# Patient Record
Sex: Male | Born: 1970 | Race: White | Hispanic: No | Smoking: Former smoker
Health system: Southern US, Community
[De-identification: ages and names within clinical notes are randomized; demographics above are authoritative.]

## PROBLEM LIST (undated history)

## (undated) DIAGNOSIS — E669 Obesity, unspecified: Secondary | ICD-10-CM

## (undated) HISTORY — PX: NASAL SINUS SURGERY: SHX719

## (undated) HISTORY — PX: TRACHEOSTOMY: SUR1362

---

## 1990-12-08 HISTORY — PX: HERNIA REPAIR: SHX51

## 1995-12-09 HISTORY — PX: FOOT SURGERY: SHX648

## 2013-03-28 ENCOUNTER — Inpatient Hospital Stay: Payer: Self-pay | Admitting: Internal Medicine

## 2013-03-28 LAB — COMPREHENSIVE METABOLIC PANEL
Alkaline Phosphatase: 72 U/L (ref 50–136)
BUN: 10 mg/dL (ref 7–18)
Bilirubin,Total: 0.7 mg/dL (ref 0.2–1.0)
Calcium, Total: 8 mg/dL — ABNORMAL LOW (ref 8.5–10.1)
Co2: 28 mmol/L (ref 21–32)
Creatinine: 0.9 mg/dL (ref 0.60–1.30)
EGFR (African American): >60
EGFR (Non-African Amer.): >60
Glucose: 100 mg/dL — ABNORMAL HIGH (ref 65–99)
SGOT(AST): 25 U/L (ref 15–37)
Sodium: 138 mmol/L (ref 136–145)
Total Protein: 7.5 g/dL (ref 6.4–8.2)

## 2013-03-28 LAB — DRUG SCREEN, URINE
Amphetamines, Ur Screen: NEGATIVE (ref ?–1000)
Barbiturates, Ur Screen: NEGATIVE (ref ?–200)
Benzodiazepine, Ur Scrn: NEGATIVE (ref ?–200)
Cannabinoid 50 Ng, Ur ~~LOC~~: NEGATIVE (ref ?–50)
Cocaine Metabolite,Ur ~~LOC~~: NEGATIVE (ref ?–300)
MDMA (Ecstasy)Ur Screen: NEGATIVE (ref ?–500)
Methadone, Ur Screen: NEGATIVE (ref ?–300)
Opiate, Ur Screen: NEGATIVE (ref ?–300)
Tricyclic, Ur Screen: NEGATIVE (ref ?–1000)

## 2013-03-28 LAB — CBC
HCT: 40.9 % (ref 40.0–52.0)
HGB: 14.3 g/dL (ref 13.0–18.0)
MCH: 28.5 pg (ref 26.0–34.0)
MCV: 82 fL (ref 80–100)
Platelet: 140 10*3/uL — ABNORMAL LOW (ref 150–440)
RBC: 5 10*6/uL (ref 4.40–5.90)

## 2013-03-28 LAB — URINALYSIS, COMPLETE
Blood: NEGATIVE
Glucose,UR: NEGATIVE mg/dL (ref 0–75)
Ketone: NEGATIVE
Leukocyte Esterase: NEGATIVE
Nitrite: NEGATIVE
Ph: 7 (ref 4.5–8.0)

## 2013-03-28 LAB — APTT: Activated PTT: 32.6 secs (ref 23.6–35.9)

## 2013-03-28 LAB — PROTIME-INR
INR: 1
Prothrombin Time: 13.5 secs (ref 11.5–14.7)

## 2013-03-29 DIAGNOSIS — I6789 Other cerebrovascular disease: Secondary | ICD-10-CM

## 2013-03-29 LAB — CBC WITH DIFFERENTIAL/PLATELET
Basophil #: 0 10*3/uL (ref 0.0–0.1)
Eosinophil #: 0.1 10*3/uL (ref 0.0–0.7)
Eosinophil %: 1.9 %
HCT: 44.5 % (ref 40.0–52.0)
Lymphocyte #: 1.7 10*3/uL (ref 1.0–3.6)
Lymphocyte %: 36.5 %
MCH: 28.4 pg (ref 26.0–34.0)
MCHC: 34.9 g/dL (ref 32.0–36.0)
Monocyte #: 0.4 x10 3/mm (ref 0.2–1.0)
Monocyte %: 8.4 %
Neutrophil #: 2.4 10*3/uL (ref 1.4–6.5)
Neutrophil %: 52.6 %
Platelet: 157 10*3/uL (ref 150–440)
RBC: 5.47 10*6/uL (ref 4.40–5.90)
WBC: 4.6 10*3/uL (ref 3.8–10.6)

## 2013-03-29 LAB — LIPID PANEL
Cholesterol: 113 mg/dL (ref 0–200)
Ldl Cholesterol, Calc: 55 mg/dL (ref 0–100)

## 2013-03-29 LAB — TSH: Thyroid Stimulating Horm: 2.99 u[IU]/mL

## 2013-03-29 LAB — RAPID HIV-1/2 QL/CONFIRM: HIV-1/2,Rapid Ql: NEGATIVE

## 2013-03-30 ENCOUNTER — Ambulatory Visit: Payer: Self-pay | Admitting: Neurology

## 2013-03-31 LAB — CSF CELL CT + PROT + GLU PANEL
CSF Tube #: 3
Eosinophil: 0 %
Lymphocytes: 73 %
Monocytes/Macrophages: 27 %
Neutrophils: 0 %
Other Cells: 0 %
Protein, CSF: 24 mg/dL (ref 15–45)
RBC (CSF): 0 /mm3
WBC (CSF): 24 /mm3

## 2013-04-04 LAB — CSF CULTURE W GRAM STAIN

## 2013-04-04 LAB — CULTURE, BLOOD (SINGLE)

## 2013-04-06 ENCOUNTER — Encounter: Payer: Self-pay | Admitting: Family Medicine

## 2013-04-07 ENCOUNTER — Encounter: Payer: Self-pay | Admitting: Family Medicine

## 2015-03-30 NOTE — Consult Note (Signed)
PATIENT NAME:  Maurice Rogers, Maurice Rogers MR#:  [id number] DATE OF BIRTH:  12-Apr-1971  DATE OF CONSULTATION:  03/30/2013  CONSULTING PHYSICIAN:  Leotis Pain, MD  REASON FOR EVALUATION: Ataxia, unsteady gait.  HISTORY OF PRESENT ILLNESS: This is a pleasant, highly functional 44 year old gentleman admitted in April 21st with ataxia gait, fevers and coordination abnormalities. For the past week or so, the patient has been complaining of upper respiratory tract infection that has resolved. During the course of the upper respiratory tract infection, he had multiple bouts of fevers as high as 102. The day following Easter, Monday, after visiting his children for Easter, he noticed that his speech was not the same, appeared to be dysarthric, and he was unsteady on his feet. He describes himself while walking as if he is drunk. He went to primary doctor for further evaluation, and the primary doctor sent him into the hospital. The patient has been in usual health prior to this upper respiratory tract infection. No recent sick contacts. Has not been in the wooded area. Currently, denies any fever and chills. 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 No recent travel or any insect bites. The patient does not take any medications. Since admission, the patient had a noncontrast MRI that did not show any acute intracranial abnormalities.   PAST MEDICAL HISTORY: Negative.   PAST SURGICAL HISTORY: Negative.   ALLERGIES: Denies any drug allergies.   MEDICATIONS: Status post Augmentin x1 dose on admission.   FAMILY HISTORY: Mother has history of diabetes and hypertension.   SOCIAL HISTORY: He lives alone. No pets. No recent contacts. He is divorced, has 2 children. No drug use and no smoking history. Works as a Pharmacist, hospital in a Automotive engineer.   PHYSICAL EXAMINATION:  VITAL SIGNS: Include a temperature of 97.6, pulse 65, respirations 19, blood pressure 124/87. NEUROLOGICAL: The patient is alert, awake, oriented to  time, place, location. He is able to tell me the President of the [place] and the reason why he is in the hospital. Cranial nerve examination: Extraocular movements are intact. Pupils 3 mm to 2 mm, reactive bilaterally. Facial sensation and facial motor intact. Tongue is midline. Uvula elevates symmetrically. Shoulder shrug intact. Motor: Upper extremity is 5 out of 5, bilateral upper and lower extremities. Coordination: Finger-to-nose intact, but very minimal dysmetria, left more than right. Dysmetria on heel-to-shin, left more than right. Sensation intact to light touch and temperature. Gait: The patient is able to ambulate on his own, but has a positive Romberg and feels unsteady on his feet.  IMAGING: The patient is status post MRI of the brain noncontrast with no acute abnormalities.   LABORATORY: The patient has had his lab work showing he had elevated ESR, which is very minimal at 15. Basic labs such as Lyme, HIV, RPR and B12 are all pending.   IMPRESSION AND PLAN: A 44 year old male with recent history of upper respiratory tract infection, had fevers, currently afebrile, comes in with a few-day history of unsteady gait, dysarthria and difficulty with coordination. Apparently, his neurological exam has improved, and coordination has improved, but still not back to his baseline.   PLAN: At this point, the possibilities of viral encephalitis, such as possibility of herpes encephalitis, have not been ruled out, even with a normal MRI since it was noncontrast. I would recommend MRI with gadolinium to see if there is any intracranial enhancement. Would also recommend a lumbar puncture that would include a cell count, cytology, Lyme titer, HSV, PCR. Other  labs are pending and will follow, including Lyme, HIV and RPR along with B12. Please order physical therapy and occupational therapy  Thank you. It was a pleasure seeing this patient.   ____________________________ Leotis Pain,  MD yz:OSi D: 03/30/2013 12:01:27 ET T: 03/30/2013 12:29:13 ET JOB#: [id number]  cc: Leotis Pain, MD, <Dictator> Leotis Pain MD ELECTRONICALLY SIGNED 05/01/2013 19:15

## 2015-03-30 NOTE — Consult Note (Signed)
PATIENT NAME:  Maurice Rogers, Maurice Rogers MR#:  [id number] DATE OF BIRTH:  March 21, 1971  DATE OF CONSULTATION:  03/29/2013  REFERRING PHYSICIAN:  Alford Highlandichard Wieting, MD CONSULTING PHYSICIAN:  Stann Mainlandavid P. Sampson GoonFitzgerald, MD  REASON FOR CONSULTATION: Slurred speech, fever and ataxia.   HISTORY OF PRESENT ILLNESS: This is a very pleasant, highly functional, 44 year old gentleman who has few medical problems in the past, admitted on April 21st with new-onset  slurring of speech, difficulty walking and a fever. He reports that 1 week ago he started to feel like he had a virus. He thought maybe he also had allergies with sinus congestion. He had no fever at that time. However, 5 days ago he felt weak at night and then awoke in the morning feeling very hot. He took his temperature, it was 102. 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 The next morning, he thought he noticed that his speech was not quite normal and he was a little unsteady on his feet but he thought it was just because he was feeling ill. However, Monday, when he went to work, he  noticed that he could not speak in front of people and his handwriting had worsened and was difficult to read. He went to his primary care doctor who admitted him for further evaluation.   The patient reports that prior to this episode he had been in his usual state of health. 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 He has not had any recent travel, animal exposure or tick bites. He has had no recent sick contacts. He takes no medication and has not had any over-the-counter medications either. He lives at home alone. No travel in the last year. He is sexually active, last time about 1 month ago. No drug use or alcohol use.   Since admission, the patient has had a work-up including an MRI which was negative and blood work which has all been nonrevealing. Drug screen was also normal. He says he has had no further  fevers, however, his speech has not improved.   PAST MEDICAL HISTORY:  Negative.   PAST SURGICAL HISTORY:  Negative.   ALLERGIES: No known drug allergies.   MEDICATIONS: As an outpatient he took no medicines. Currently, he is on only acetaminophen. He did receive aspirin. He also received 1 dose of amoxicillin clavulanic acid on April 22nd at 10:00 in the morning.   FAMILY HISTORY: His mother is alive, has diabetes and high blood pressure. He does not know his father's history.   SOCIAL HISTORY: He lives alone, has no pets. He is divorced, has 2 children age 46 and 629 who live about 1 hour away. He quit smoking about 10 years ago. He drinks alcohol once every few weeks. No illicit drug use. Sexually active, last time about 1 month ago. 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 No exposure to heavy metals or dust. He is not a camper or outdoor person, no tick bites or pet exposure. No travel.   ALLERGIES: No known drug allergies.   PHYSICAL EXAMINATION: VITALS: T-max since admission 98.3 but he reports a temperature of 102 on Friday, pulse 55 to 63, respirations 18, blood pressure 155/102, down to 131/86, sat 98% on room air.  GENERAL: He is a large, well-built gentleman in no acute distress. He does have obvious slurred speech but does not seem perturbed by this. His mood is good. Affect is appropriate.  His pupils are equal, round, reactive to light and accommodation. Extraocular movements are intact. Cranial nerves II through XII are intact.  OROPHARYNX: He does have slight elevation of his left lung at the base and maybe some asymmetric enlargement of his left tonsil and just mild erythema. He points to a spot on hard palate that he says is tender but there are no sores there. There are no palpable lesions in his throat. His dentition is moderate.  NECK: Supple. He has no anterior cervical, posterior cervical or supraclavicular lymphadenopathy.  HEART: Regular.  LUNGS: Clear to auscultation  bilaterally.  ABDOMEN: Soft, nontender, no1ndistended. No hepatosplenomegaly.  EXTREMITIES: He has no clubbing, cyanosis or edema.  SKIN: He has on his left inner upper arm a heart-shaped area of slightly raised edges that is approximately 5 to 6 cm across. There is central clearing. It appears as if it could be a tinea-type rash. 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  NEUROLOGIC: He is awake, alert, interactive and oriented x 3. He does have slurred speech and is speaking quite slowly. Strength is 5 out of 5 bilateral upper extremity and lower extremities. There is no tremor. He has poor finger-to-nose bilaterally. Heel-to-shin is decreased on the right. His gait is slightly ataxic but he can walk a straight line without falling. Romberg is negative but he does seem a little unsteady with his eyes closed, but he does not fall.   LABORATORY, DIAGNOSTIC AND RADIOLOGICAL DATA: White blood count on admission 3.9, hemoglobin 14.3, platelets 140. INR 1.0, PTT normal. Urinalysis negative. Drug screen urine negative. LFTs normal. Glucose 100, BUN 10, creatinine 0.9. Sodium, potassium, chloride and bicarb are normal. LDL is 55. Calcium is slightly low at 8.0. MRI done without gadolinium reveals no MRI evidence of focal or acute abnormalities. Carotid ultrasound is negative.   IMPRESSION: A pleasant 44 year old functional gentleman with no medical history, now with a fever followed by dysarthria, some slurred speech and some mild ataxia. On exam, he does have slightly enlarged left tonsil and slight elevation of his left tongue but no other oral lesions. His cranial nerves and strength are intact. He does have poor finger-to-nose and some mild ataxia. MRI is normal. Initial blood work is negative except for some thrombocytopenia.   The differential diagnosis is broad for this and includes both infectious and noninfectious etiologies. He did have a fever but  his white count is normal. He has really no significant exposures that he can remember, but I have asked him to think back over the last several months to see if he has had any unusual exposures.   RECOMMENDATIONS: 1.  We will check RPR, HIV, Lyme serologies, B12, TSH and a sed rate.  2.  Consult neurology.   3.  He likely would need an LP but will see what neurology thinks tomorrow.  4.  Since he has no fever currently and is not septic, I doubt this is a bacterial process. I would recommend holding any antibiotics. We will check blood cultures x 2 sets in case he has some occult endocarditis, although he did receive a dose of Augmentin so that may be falsely negative.   Thank you for the consult. I will be glad to follow with you.    ____________________________ Stann Mainland. Sampson Goon, MD dpf:cs D: 03/29/2013 17:09:42 ET T: 03/29/2013 18:33:34 ET JOB#: [id number]  cc: Stann Mainland. Sampson Goon, MD, <Dictator> DAVID Sampson Goon MD ELECTRONICALLY  SIGNED 04/06/2013 12:09

## 2015-03-30 NOTE — Consult Note (Signed)
PATIENT NAME:  Maurice Rogers, Maurice Rogers MR#:  [id number] DATE OF BIRTH:  05/09/1971  DATE OF CONSULTATION:  03/31/2013  CONSULTING PHYSICIAN:  Pauletta BrownsYuriy Tanza Pellot, MD  REASON FOR RE-EVALUATION: Ataxia, unsteady gait and dysarthric speech.   HISTORY OF PRESENT ILLNESS: Pleasant, highly functional 44 year old gentleman admitted on April 21 with ataxic gait, fevers, coordination abnormalities and dysarthric speech. Recent history includes upper respiratory tract infections and fevers, post which the patient has been complaining of above symptoms. Throughout the hospital course, the patient states he is feeling slightly better but not back to his baseline. He is status post noncontrast MRI of the brain and contrast MRI of the brain yesterday that did not show any signs of encephalitis and no signs of any enhancement, which was a normal study. The patient is status post lumbar puncture this morning, done by Dr. Sampson GoonFitzgerald.   PHYSICAL EXAMINATION:   VITAL SIGNS: Temperature of 98.2, pulse 64, respirations 18, blood pressure 132/83. NEUROLOGIC: The patient is alert, awake, oriented to time, place, location. He is telling me the date and time, as well as the president of the [place]. Cranial nerve examination: Extraocular movements are intact. Pupils 3 mm to 2 mm and reactive. Facial sensation and facial motor intact. Visual fields intact bilaterally. Tongue is midline. Uvula elevates symmetrically. Shoulder shrug intact. Motor examination: 5/5 bilateral upper and lower extremities. Coordination: Finger-to-nose intact. Dysarthric speech is present. Slight dysmetria on heel-to-shin, mostly on the left. Gait: Not assessed, as the patient is status post LP, to make sure the patient does not have post LP headache.  IMAGING: MRI of the brain without contrast and MRI of brain with gadolinium as above, no acute intracranial abnormality.   IMPRESSION: A 44 year old gentleman with a history upper respiratory tract infection  comes in with 2-day history of dysarthric speech, ataxia and difficulty with coordination. His neurological exam is improving but still not back to baseline. At this point in time, the etiology of such symptoms is not clear. Still suspect it is viral etiology and that we might not be able to obtain history of, or will not be able to see on lumbar puncture. Dr. Sampson GoonFitzgerald was nice enough to do the lumbar puncture and send typical stuff such as CMV, Lyme, HIV, RPR and EBV. There are other diagnoses that are possible such as episodic ataxia type I, type II, and other degenerative neurological conditions, but usually they do not present all of a sudden and if they do at his age, it would not be the first-time event, which the patient states it clearly is.  PLAN: Supportive care at this time, hydration, physical therapy, occupational therapy, speech therapy. Will review results of lumbar puncture once available.  Thank you. It was a pleasure seeing this patient.   ____________________________ Pauletta BrownsYuriy Emaad Nanna, MD yz:jm D: 03/31/2013 14:48:39 ET T: 03/31/2013 15:28:20 ET JOB#: [id number]  cc: Pauletta BrownsYuriy Yakelin Grenier, MD, <Dictator> Pauletta BrownsYURIY Sloka Volante MD ELECTRONICALLY SIGNED 05/01/2013 19:16

## 2015-03-30 NOTE — H&P (Signed)
PATIENT NAME:  Maurice Rogers, Maurice C MR#:  [id number] DATE OF BIRTH:  10-Feb-1971  DATE OF ADMISSION:  03/28/2013  PRIMARY CARE PHYSICIAN:  Dr. Vonita MossMark Crissman  CHIEF COMPLAINT:  Problems with coordination, balance and ataxic gait.   HISTORY OF PRESENT ILLNESS: This is a 44 year old male sent over to the ER from his primary care physician's office due to ataxic gait and problems with coordination. The patient says that he had not been feeling well over the weekend, where he had some upper respiratory symptoms and just generally felt weak. Over the weekend he also noticed that he had problems with coordination, where he was swaying and walking to one side or the other. The patient this morning went to work, as he works at [employer] as a Runner, broadcasting/film/videoteacher, and his handwriting was also quite terrible, as per him. He went to see his primary care physician, who then referred him to the ER. In the Emergency Room, the patient was evaluated by the ER physician and noted to have an abnormal finger-to-nose test, and also a mildly ataxic gait on ambulation. Hospitalist services were contacted for further treatment and evaluation. The patient denies any headache, any nausea, vomiting, any focal weakness, or any numbness and tingling presently.   REVIEW OF SYSTEMS: CONSTITUTIONAL:  No documented fever. No weight gain. No weight loss.  EYES:  No blurry or double vision.  EARS, NOSE, THROAT:  No tinnitus. No postnasal drip. No redness of oropharynx.  RESPIRATORY:  No cough, no wheeze, no hemoptysis, no dyspnea.  CARDIOVASCULAR:  No chest pain, no orthopnea, no palpitations, no syncope.  GASTROINTESTINAL:  No nausea, no vomiting, no diarrhea, no abdominal pain, no melena or hematochezia.  GENITOURINARY: No dysuria, no hematuria.  ENDOCRINE:  No polyuria or nocturia. No heat or cold intolerance.   HEMATOLOGIC:  No anemia. No bruising. No bleeding.  INTEGUMENT:  No rashes. No lesions.  MUSCULOSKELETAL:  No arthritis, no  swelling, no gout.  NEUROLOGIC:  No numbness or tingling. No ataxia. No seizure-type activity.  PSYCHIATRIC:  No anxiety. No insomnia. No ADD.   PAST MEDICAL HISTORY:  Has no significant past medical history.   ALLERGIES:  No known drug allergies.   SOCIAL HISTORY:  He used to be a smoker, quit 10+ years ago. No alcohol abuse. No illicit drug abuse. Lives at home by himself.   FAMILY HISTORY:  Mother is alive, has diabetes and high blood pressure, and a history of coronary artery disease. He cannot recall his father's history.   CURRENT MEDICATIONS:  He is currently on no medications.   PHYSICAL EXAMINATION ON ADMISSION IS AS FOLLOWS:  Patient's vital signs are noted to be temperature 98.4, pulse 67, respirations 18, blood pressure 168/90, sats 95% on room air.  GENERAL:  The patient is a pleasant-appearing male in no apparent distress.  HEAD, EYES, EARS, NOSE, THROAT EXAM:  Atraumatic, normocephalic. Extraocular muscles are intact. Pupils equal and reactive to light. Sclerae are anicteric. No conjunctival injection. No oropharyngeal erythema.  NECK: Supple. There is no jugular venous distention. No bruits. No lymphadenopathy or thyromegaly.  HEART EXAM:  Regular rate and rhythm. No murmurs, no rubs, no clicks.  LUNGS:  Clear to auscultation bilaterally. No rales. No rhonchi. No wheezes.  ABDOMEN: Soft, flat, nontender, nondistended. Has good bowel sounds. No hepatosplenomegaly appreciated.  EXTREMITIES:  No evidence of any cyanosis, clubbing, or peripheral edema. Has +2 pedal and radial pulses bilaterally.  NEUROLOGICAL:  Patient is alert, awake, oriented x 3. Patient  does have a slightly ataxic gait and a positive finger-to-nose test, but otherwise no other focal motor or sensory deficits appreciated bilaterally.  SKIN:  Moist and warm, with no rashes appreciated.  LYMPHATIC:  There is no cervical or axillary lymphadenopathy.   LABORATORY EXAM:  Showed a serum glucose of 100, BUN 10,  creatinine 0.9, sodium 138, potassium 3.9, chloride 104, bicarb 28. LFTs are within normal limits. White cell count 3.9, hemoglobin 14.3, hematocrit 40.9, platelet count 140.  INR 1. The patient did have a CT of the head done, which showed no evidence of any acute intracranial abnormality.   ASSESSMENT AND PLAN:  This is a 44 year old male with no significant past medical history who presents to the hospital with ataxic gait and problems with coordination, suspected to have a cerebrovascular accident.  Ataxic gait. Suspected CVA. I will observe the patient on telemetry, follow q. 4 hour neuro checks, watch for any arrhythmias. His initial CT head is negative. His exam is very subtly positive for an ataxic gait and a positive finger-to-nose test presently. Will go ahead and get an MRI of the brain, get a carotid duplex and a 2-dimensional echocardiogram. Will continue him on some aspirin. Will check a lipid profile in the morning for now.   The plan was discussed with the patient and the patient's mother, and they are in agreement. The patient is a FULL CODE.   Time spent on admission is 45 minutes.    ____________________________ Rolly Pancake. Cherlynn Kaiser, MD vjs:mr D: 03/28/2013 19:04:12 ET T: 03/28/2013 19:52:49 ET JOB#: [id number]  cc: Rolly Pancake. Cherlynn Kaiser, MD, <Dictator> [place] Siren MD ELECTRONICALLY SIGNED 04/02/2013 14:47

## 2015-03-30 NOTE — Discharge Summary (Signed)
PATIENT NAME:  Maurice Rogers, Maurice Rogers MR#:  [id number] DATE OF BIRTH:  19-Mar-1971  DATE OF ADMISSION:  03/28/2013  DATE OF DISCHARGE:  04/01/2013  ADMITTING PHYSICIAN:  Dr. Abel Presto.   DISCHARGING PHYSICIAN: Gladstone Lighter.      PRIMARY CARE PHYSICIAN:  Dr. Golden Pop.   CONSULTATIONS IN THE HOSPITAL:  1.  ID consultation with Dr. Ola Spurr.  2.  Neurology consultation by Dr. Irish Elders.   DISCHARGE DIAGNOSES: 1.  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  2.  Psoriasis.   DISCHARGE HOME MEDICATIONS:  None.  DISCHARGE DIET:  Regular diet.   DISCHARGE ACTIVITY: As tolerated.     FOLLOWUP INSTRUCTIONS: 1.  Follow up with Dr. Ola Spurr in 1 week.  2.  PCP followup in 2 weeks.   LABORATORY AND IMAGING STUDIES PRIOR TO DISCHARGE: CSF cell count with 24 WBCs, with 0 RBCs and 73% of them were lymphocytes and 27% were monocytes. CSF Gram stain and cultures were negative. Blood cultures were negative. MRI of the brain with and without contrast showing no evidence of abnormal parenchymal enhancement, nonenhancing masses or nodules. No acute changes seen.   WBC 4.6, hemoglobin 15.3, hematocrit 44.5, platelet count 157. Lyme antibodies for IgM were negative. Lying Western blot showing positive IgG antibodies. ESR slightly elevated at 18. TSH normal at 2.99.  HIV test is negative. RPR is nonreactive. Vitamin B12 is within normal limits at 483.   Echo Doppler showing  Normal LV ejection fraction, EF of 55% to 60% noted. MRI of the brain without contrast showing no evidence of acute abnormalities. LDL 55, HDL 19, triglycerides 193, total cholesterol 113.   Ultrasound Doppler's carotids showing no significant stenosis.  Urinalysis negative for infection. Urine tox screen is negative. CT of the head negative on admission.   BRIEF HOSPITAL COURSE:  The patient is a 44 year old male with no significant past medical history, who works as a professor at [employer], comes to the hospital secondary to ataxia and also problems with coordination, especially speech, and also handwriting. Initially admitted for transient ischemic attack.  1.  Ataxia and ataxic dysarthria with stiffness while walking, and also fine coordination, like handwriting and also while balancing. Initially thought to be cerebellar transient ischemic attack. MRI of the brain negative. Carotid Dopplers normal. Echo was normal. He does not have risk factors for any cerebrovascular accident or transient ischemic attack. His symptoms persisted while in the hospital, so ID consult and neurology consult were requested.  The patient had viral cold-like symptoms 2 days prior to the onset of his neurological symptoms. His lumbar puncture did reveal left-sided predominance of only elevated white cell count without any WBCs,  so it could be nonspecific versus postviral syndrome, which is common in pediatric patients.  However, all the other PCRs for different viruses have been sent out.  Lyme  disease, RPR, HIV came back negative. The patient's symptoms did not improve, also did not get worse. If this is a postviral encephalitis kind of syndrome, usually resolves spontaneously over time. Since he is in the profession of teaching and because of loss of coordination with handwriting and also speaking, he was given a work note for 2 weeks off, and he will follow up with outpatient speech and also physical therapy as needed. He will follow up with Dr. Ola Spurr next week when all the serologies will be back.  He was not given any other additional medications.  His MRI of the brain with and without  contrast was also negative for any meningitis or acute encephalitis. No evidence of bacterial infection, so no antibiotics were given. He had a dose of Augmentin when he came in for possible otitis media, which improved at this time.  2.  Psoriasis. The patient has early psoriasis, especially on his elbows and also  knees, and is not on any treatment at this time. His course has been otherwise  uneventful in the hospital.   DISCHARGE CONDITION: Stable.   DISCHARGE DISPOSITION: Home.   TIME SPENT ON DISCHARGE: 45 minutes.    ____________________________ Gladstone Lighter, MD rk:dmm D: 04/01/2013 15:56:00 ET T: 04/01/2013 22:33:59 ET JOB#: [id number]  cc: Gladstone Lighter, MD, <Dictator> Cheral Marker. Ola Spurr, MD Guadalupe Maple, MD Gladstone Lighter MD ELECTRONICALLY SIGNED 04/07/2013 15:52

## 2015-07-05 ENCOUNTER — Ambulatory Visit (INDEPENDENT_AMBULATORY_CARE_PROVIDER_SITE_OTHER): Payer: BC Managed Care – PPO | Admitting: Family Medicine

## 2015-07-05 ENCOUNTER — Encounter: Payer: Self-pay | Admitting: Family Medicine

## 2015-07-05 VITALS — BP 125/84 | HR 61 | Temp 98.9°F | Ht 70.4 in | Wt 261.9 lb

## 2015-07-05 DIAGNOSIS — Z113 Encounter for screening for infections with a predominantly sexual mode of transmission: Secondary | ICD-10-CM

## 2015-07-05 DIAGNOSIS — N485 Ulcer of penis: Secondary | ICD-10-CM

## 2015-07-05 MED ORDER — VALACYCLOVIR HCL 1 G PO TABS: 1000.0000 mg | ORAL_TABLET | Freq: Two times a day (BID) | ORAL | Status: DC

## 2015-07-05 MED ORDER — AZITHROMYCIN 250 MG PO TABS: ORAL_TABLET | ORAL | Status: DC

## 2015-07-05 NOTE — Progress Notes (Signed)
BP 125/84 mmHg  Pulse 61  Temp(Src) 98.9 F (37.2 C)  Ht 5' 10.4" (1.788 m)  Wt 261 lb 14.4 oz (118.797 kg)  BMI 37.16 kg/m2  SpO2 97%   Subjective:    Patient ID: Maurice Rogers, male    DOB: [birth year], 44 y.o.   MRN: [id number]  HPI: Maurice Rogers is a 44 y.o. male  Chief Complaint  Patient presents with  . skin lesion    friction burn on penis. patient was not sexually active for about 6 weeks, was getting better. Patient had sex over the now he has blisters, there was four now they are starting to heal over.   . std screening    All STD's    SKIN LESION- had had a friction burn on his penis that didn't seem to recover with sexual activity-  Duration: Saturday/Sunday Location: penis Painful: yes when touched Itching: no Onset: sudden Context: smaller Associated signs and symptoms: have turned white and are oozing  STD SCREENING Sexual activity:  In a Monogamous Relationship Contraception: no Recent unprotected intercourse: yes History of sexually transmitted diseases: no Previous sexually transmitted disease screening: no Lifetime sexual partners:  Genital lesions: yes Penile discharge: no Dysuria: no Swollen lymph nodes: no Fevers: no Rash: yes  Relevant past medical, surgical, family and social history reviewed and updated as indicated. Interim medical history since our last visit reviewed. Allergies and medications reviewed and updated.  Review of Systems  Constitutional: Negative.   Respiratory: Negative.   Cardiovascular: Negative.   Gastrointestinal: Negative.   Genitourinary: Negative.   Skin: Positive for rash. Negative for color change, pallor and wound.  Psychiatric/Behavioral: Negative.    Per HPI unless specifically indicated above     Objective:    BP 125/84 mmHg  Pulse 61  Temp(Src) 98.9 F (37.2 C)  Ht 5' 10.4" (1.788 m)  Wt 261 lb 14.4 oz (118.797 kg)  BMI 37.16 kg/m2  SpO2 97%  Wt Readings from Last 3 Encounters:    07/05/15 261 lb 14.4 oz (118.797 kg)  12/12/14 250 lb (113.399 kg)    Physical Exam  Constitutional: He is oriented to person, place, and time. He appears well-developed and well-nourished. No distress.  HENT:  Head: Normocephalic and atraumatic.  Right Ear: Hearing normal.  Left Ear: Hearing normal.  Nose: Nose normal.  Eyes: Conjunctivae and lids are normal. Right eye exhibits no discharge. Left eye exhibits no discharge. No scleral icterus.  Cardiovascular: Normal rate, regular rhythm and normal heart sounds.  Exam reveals no gallop and no friction rub.   No murmur heard. Pulmonary/Chest: Effort normal and breath sounds normal. No respiratory distress. He has no wheezes. He has no rales. He exhibits no tenderness.  Genitourinary:     ulcers  Musculoskeletal: Normal range of motion.  Neurological: He is alert and oriented to person, place, and time.  Skin: Skin is intact. No rash noted.  Psychiatric: He has a normal mood and affect. His speech is normal and behavior is normal. Judgment and thought content normal. Cognition and memory are normal.  Nursing note and vitals reviewed.     Assessment & Plan:   Problem List Items Addressed This Visit    None    Visit Diagnoses    Penile ulcer    -  Primary    Possibly herpes or chanchroid or irrtiation from contact. Will test for all STIs. Will treat for herpes and chanchroid and advised abstaining until resolved.  Relevant Orders    Gram stain    Screening examination for venereal disease        Will check today. Labs drawn.     Relevant Orders    HIV antibody    Hepatitis, Acute    HSV(herpes simplex vrs) 1+2 ab-IgG    GC/Chlamydia Probe Amp    RPR    Gram stain        Follow up plan: Return if symptoms worsen or fail to improve.

## 2015-07-05 NOTE — Patient Instructions (Signed)

## 2015-07-06 LAB — HSV(HERPES SIMPLEX VRS) I + II AB-IGG
HSV 1 Glycoprotein G Ab, IgG: 51.7 index — ABNORMAL HIGH (ref 0.00–0.90)
HSV 2 Glycoprotein G Ab, IgG: 6.61 index — ABNORMAL HIGH (ref 0.00–0.90)

## 2015-07-06 LAB — RPR: RPR Ser Ql: NONREACTIVE

## 2015-07-06 LAB — HIV ANTIBODY (ROUTINE TESTING W REFLEX): HIV SCREEN 4TH GENERATION: NONREACTIVE

## 2015-07-06 LAB — HEPATITIS PANEL, ACUTE
HEP B S AG: NEGATIVE
Hep A IgM: NEGATIVE
Hep B C IgM: NEGATIVE

## 2015-07-07 LAB — GRAM STAIN: ORGANISM ID, BACTERIA: NONE SEEN

## 2015-07-07 LAB — GC/CHLAMYDIA PROBE AMP
CHLAMYDIA, DNA PROBE: NEGATIVE
Neisseria gonorrhoeae by PCR: NEGATIVE

## 2015-07-09 ENCOUNTER — Telehealth: Payer: Self-pay | Admitting: Family Medicine

## 2015-07-09 NOTE — Telephone Encounter (Signed)
Called to give patient results of his blood work. LMOM for him to call back. 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 This is likely what is causing the ulcers on his penis. First outbreak is usually the worst, he's on medicine for it already. If it seems to be happening more often, we will start him on medicine to prevent it. He does have the potential to pass it to any partners, condoms are the best way to prevent passing it.

## 2015-07-10 NOTE — Telephone Encounter (Signed)
Results given to patient. No questions.

## 2016-07-17 ENCOUNTER — Encounter: Payer: Self-pay | Admitting: Family Medicine

## 2016-07-17 ENCOUNTER — Ambulatory Visit (INDEPENDENT_AMBULATORY_CARE_PROVIDER_SITE_OTHER): Payer: BC Managed Care – PPO | Admitting: Family Medicine

## 2016-07-17 VITALS — BP 122/68 | HR 60 | Temp 98.0°F | Resp 16 | Ht 71.0 in | Wt 282.2 lb

## 2016-07-17 DIAGNOSIS — Z0000 Encounter for general adult medical examination without abnormal findings: Secondary | ICD-10-CM

## 2016-07-17 DIAGNOSIS — L409 Psoriasis, unspecified: Secondary | ICD-10-CM | POA: Diagnosis not present

## 2016-07-17 DIAGNOSIS — Z6839 Body mass index (BMI) 39.0-39.9, adult: Secondary | ICD-10-CM

## 2016-07-17 DIAGNOSIS — Z6841 Body Mass Index (BMI) 40.0 and over, adult: Secondary | ICD-10-CM

## 2016-07-17 DIAGNOSIS — Z8249 Family history of ischemic heart disease and other diseases of the circulatory system: Secondary | ICD-10-CM | POA: Diagnosis not present

## 2016-07-17 DIAGNOSIS — Z833 Family history of diabetes mellitus: Secondary | ICD-10-CM | POA: Diagnosis not present

## 2016-07-17 LAB — COMPLETE METABOLIC PANEL WITH GFR
ALBUMIN: 4.1 g/dL (ref 3.6–5.1)
ALK PHOS: 79 U/L (ref 40–115)
ALT: 13 U/L (ref 9–46)
AST: 15 U/L (ref 10–40)
BUN: 13 mg/dL (ref 7–25)
CO2: 29 mmol/L (ref 20–31)
Calcium: 9 mg/dL (ref 8.6–10.3)
Chloride: 103 mmol/L (ref 98–110)
Creat: 0.89 mg/dL (ref 0.60–1.35)
GFR, Est African American: >89 mL/min (ref >=60)
GFR, Est Non African American: >89 mL/min (ref >=60)
GLUCOSE: 86 mg/dL (ref 65–99)
POTASSIUM: 3.8 mmol/L (ref 3.5–5.3)
SODIUM: 138 mmol/L (ref 135–146)
Total Bilirubin: 0.9 mg/dL (ref 0.2–1.2)
Total Protein: 6.5 g/dL (ref 6.1–8.1)

## 2016-07-17 LAB — LIPID PANEL
CHOLESTEROL: 147 mg/dL (ref 125–200)
HDL: 34 mg/dL — AB (ref >=40)
LDL CALC: 74 mg/dL (ref <130)
TRIGLYCERIDES: 195 mg/dL — AB (ref <150)
Total CHOL/HDL Ratio: 4.3 Ratio (ref <=5.0)
VLDL: 39 mg/dL — ABNORMAL HIGH (ref <30)

## 2016-07-17 MED ORDER — TRIAMCINOLONE ACETONIDE 0.1 % EX CREA: 1.0000 "application " | TOPICAL_CREAM | Freq: Two times a day (BID) | CUTANEOUS | 0 refills | Status: DC

## 2016-07-17 NOTE — Assessment & Plan Note (Signed)
Reviewed health risks of obesity. Encouraged continued diet and lifestyle changes. Referred to DisposableNylon.bechoosemyplate.gov for help with food choices.  Pt has goals to lose weight.

## 2016-07-17 NOTE — Progress Notes (Signed)
Subjective:    Patient ID: Maurice Rogers, male    DOB: 08/12/1971, 45 y.o.   MRN: [id number]  HPI: Maurice Rogers is a 45 y.o. male presenting on 07/17/2016 for Establish Care   HPI  Pt presents to establish care today. Previous care provider was Olevia Perches, DO at [facility].  It has been 1 years since His last PCP visit. Records from previous provider will be requested and reviewed. Current medical problems include:  Health maintenance:  No family history of prostate or colon.  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. 4-6 days per week. Doing resistance and cardio. 30 minutes cardio/30 resistance/ 30 minutes.  Strong family history of diabetes.  TDAP 2013.  PSA 2015.  Non-smoker. No ETOH.  Sexually active- STI testing last year.    No past medical history on file. Social History   Social History  . Marital status: Divorced    Spouse name: N/A  . Number of children: N/A  . Years of education: N/A   Occupational History  . Not on file.   Social History Main Topics  . Smoking status: Former Smoker    Quit date: 12/08/2000  . Smokeless tobacco: Never Used  . Alcohol use No  . Drug use: No  . Sexual activity: Not Currently   Other Topics Concern  . Not on file   Social History Narrative  . No narrative on file   Family History  Problem Relation Age of Onset  . Diabetes Mother   . Mental illness Mother     bipolar, depression  . Seizures Mother   . Stroke Mother   . Diabetes Sister   . Diabetes Maternal Grandfather   . CAD Paternal Grandfather   . Hyperlipidemia Paternal Grandfather   . Hypertension Paternal Grandfather   . Heart disease Paternal Grandfather   . Cancer Paternal Grandfather   . Heart disease Father   . Dementia Maternal Grandmother    No current outpatient prescriptions on file prior to visit.   No current facility-administered medications on file prior to visit.     Review of Systems  Constitutional:  Negative for chills and fever.  HENT: Negative.   Respiratory: Negative for chest tightness, shortness of breath and wheezing.   Cardiovascular: Negative for chest pain, palpitations and leg swelling.  Gastrointestinal: Negative for abdominal pain, nausea and vomiting.  Endocrine: Negative.   Genitourinary: Negative for discharge, dysuria, penile pain, testicular pain and urgency.  Musculoskeletal: Negative for arthralgias, back pain and joint swelling.  Skin: Negative.   Neurological: Negative for dizziness, weakness, numbness and headaches.  Psychiatric/Behavioral: Negative for dysphoric mood and sleep disturbance.   Per HPI unless specifically indicated above     Objective:    BP 122/68 (BP Location: Left Arm, Patient Position: Sitting, Cuff Size: Large)   Pulse 60   Temp 98 F (36.7 C) (Oral)   Resp 16   Ht  (1.803 m)   Wt 282 lb 3.2 oz (128 kg)   BMI 39.36 kg/m   Wt Readings from Last 3 Encounters:  07/17/16 282 lb 3.2 oz (128 kg)  07/05/15 261 lb 14.4 oz (118.8 kg)  12/12/14 250 lb (113.4 kg)    Physical Exam  Constitutional: He is oriented to person, place, and time. He appears well-developed and well-nourished. No distress.  HENT:  Head: Normocephalic and atraumatic.  Neck: Neck supple. No thyromegaly present.  Cardiovascular: Normal rate, regular rhythm and normal heart  sounds.  Exam reveals no gallop and no friction rub.   No murmur heard. Pulmonary/Chest: Effort normal and breath sounds normal. He has no wheezes.  Abdominal: Soft. Bowel sounds are normal. He exhibits no distension. There is no tenderness. There is no rebound.  Genitourinary:  Genitourinary Comments: Deferred 2/2 patient preference.   Musculoskeletal: Normal range of motion. He exhibits no edema or tenderness.  Neurological: He is alert and oriented to person, place, and time. He has normal reflexes.  Skin: Skin is warm and dry. No rash noted. No erythema.  Psychiatric: He has a normal  mood and affect. His behavior is normal. Thought content normal.   Results for orders placed or performed in visit on 07/05/15  GC/Chlamydia Probe Amp  Result Value Ref Range   Chlamydia trachomatis, NAA Negative Negative   Neisseria gonorrhoeae by PCR Negative Negative   PLEASE NOTE: Comment   Gram stain  Result Value Ref Range   Gram Stain Result Final report    Result 1 Comment    RESULT 2 No organisms seen   HIV antibody  Result Value Ref Range   HIV Screen 4th Generation wRfx Non Reactive Non Reactive  Hepatitis, Acute  Result Value Ref Range   Hep A IgM Negative Negative   Hepatitis B Surface Ag Negative Negative   Hep B C IgM Negative Negative   Hep C Virus Ab <0.1 0.0 - 0.9 s/co ratio  HSV(herpes simplex vrs) 1+2 ab-IgG  Result Value Ref Range   HSV 1 Glycoprotein G Ab, IgG 51.70 (H) 0.00 - 0.90 index   HSV 2 Glycoprotein G Ab, IgG 6.61 (H) 0.00 - 0.90 index  RPR  Result Value Ref Range   RPR Ser Ql Non Reactive Non Reactive      Assessment & Plan:   Problem List Items Addressed This Visit      Musculoskeletal and Integument   Psoriasis    Plaques on elbows and knees. Never been treated. Will treat with triamcinolone PRN.       Relevant Medications   triamcinolone cream (KENALOG) 0.1 %     Other   Family history of diabetes mellitus    Will screen hemoglobin A1c today.       Relevant Orders   Hemoglobin A1c   Family history of heart disease   Relevant Orders   Lipid Profile   BMI 39.0-39.9,adult    Reviewed health risks of obesity. Encouraged continued diet and lifestyle changes. Referred to DisposableNylon.bechoosemyplate.gov for help with food choices.  Pt has goals to lose weight.        Other Visit Diagnoses    Preventative health care    -  Primary   Relevant Orders   COMPLETE METABOLIC PANEL WITH GFR   Lipid Profile   Hemoglobin A1c      Meds ordered this encounter  Medications  . triamcinolone cream (KENALOG) 0.1 %    Sig: Apply 1 application  topically 2 (two) times daily.    Dispense:  30 g    Refill:  0    Order Specific Question:   Supervising Provider    Answer:   Janeann ForehandHAWKINS JR, JAMES H [[id number]]      Follow up plan: Return in about 1 year (around 07/17/2017), or if symptoms worsen or fail to improve.

## 2016-07-17 NOTE — Assessment & Plan Note (Signed)
Will screen hemoglobin A1c today.

## 2016-07-17 NOTE — Patient Instructions (Addendum)

## 2016-07-17 NOTE — Assessment & Plan Note (Signed)
Plaques on elbows and knees. Never been treated. Will treat with triamcinolone PRN.

## 2016-07-18 LAB — HEMOGLOBIN A1C
Hgb A1c MFr Bld: 4.7 % (ref <5.7)
MEAN PLASMA GLUCOSE: 88 mg/dL

## 2016-08-06 ENCOUNTER — Ambulatory Visit
Admission: RE | Admit: 2016-08-06 | Discharge: 2016-08-06 | Disposition: A | Discharge status: Home / Self Care | Payer: BC Managed Care – PPO | Source: Ambulatory Visit | Attending: Family Medicine | Admitting: Family Medicine

## 2016-08-06 ENCOUNTER — Encounter: Payer: Self-pay | Admitting: Family Medicine

## 2016-08-06 ENCOUNTER — Ambulatory Visit (INDEPENDENT_AMBULATORY_CARE_PROVIDER_SITE_OTHER): Payer: BC Managed Care – PPO | Admitting: Family Medicine

## 2016-08-06 VITALS — BP 136/86 | HR 57 | Temp 98.6°F | Resp 16 | Ht 71.0 in | Wt 275.6 lb

## 2016-08-06 DIAGNOSIS — J181 Lobar pneumonia, unspecified organism: Secondary | ICD-10-CM | POA: Diagnosis not present

## 2016-08-06 DIAGNOSIS — J069 Acute upper respiratory infection, unspecified: Secondary | ICD-10-CM | POA: Diagnosis not present

## 2016-08-06 DIAGNOSIS — R05 Cough: Secondary | ICD-10-CM

## 2016-08-06 DIAGNOSIS — R059 Cough, unspecified: Secondary | ICD-10-CM

## 2016-08-06 MED ORDER — AZITHROMYCIN 250 MG PO TABS: ORAL_TABLET | ORAL | 0 refills | Status: DC

## 2016-08-06 MED ORDER — PREDNISONE 20 MG PO TABS: 40.0000 mg | ORAL_TABLET | Freq: Every day | ORAL | 0 refills | Status: DC

## 2016-08-06 MED ORDER — BENZONATATE 100 MG PO CAPS: 100.0000 mg | ORAL_CAPSULE | Freq: Three times a day (TID) | ORAL | 0 refills | Status: DC | PRN

## 2016-08-06 MED ORDER — ALBUTEROL SULFATE HFA 108 (90 BASE) MCG/ACT IN AERS: 2.0000 | INHALATION_SPRAY | Freq: Four times a day (QID) | RESPIRATORY_TRACT | 0 refills | Status: DC | PRN

## 2016-08-06 NOTE — Patient Instructions (Addendum)
You can use supportive care at home to help with your symptoms. I have sent Mucinex DM to your pharmacy to help break up the congestion and soothe your cough. You can takes this twice daily.  I have also sent tesslon perles to your pharmacy to help with the cough- you can take these 3 times daily as needed. Honey is a natural cough suppressant- so add it to your tea in the morning.  If you have a humidifer, set that up in your bedroom at night.   Please seek immediate medical attention if you develop shortness of breath not relieve by inhaler, chest pain/tightness, fever > 103 F or other concerning symptoms.   You can take sudafed for congestion over the counter. You can also use Mucinex DM to help with cough and congestion.

## 2016-08-06 NOTE — Progress Notes (Signed)
Subjective:    Patient ID: Maurice Rogers, male    DOB: 1971-03-11, 45 y.o.   MRN: [id number]  HPI: Maurice CairoBently C Bachicha is a 45 y.o. male presenting on 08/06/2016 for Cough (chest congestion achy onset last week 8 days Sx not improving runny nose )   HPI  Pt presents for cough and congestion. Symptoms started last Tuesday. Started with scratchy throat. Cough developed right away. Thought it was allergies. Never improved. Cough is productive of scant amounts of sputums. Some chest tightness. No fevers.   No past medical history on file.  No current outpatient prescriptions on file prior to visit.   No current facility-administered medications on file prior to visit.     Review of Systems Per HPI unless specifically indicated above     Objective:    BP 136/86 (BP Location: Right Arm, Patient Position: Sitting, Cuff Size: Normal)   Pulse (!) 57   Temp 98.6 F (37 C) (Oral)   Resp 16   Ht 5\' 11"  (1.803 m)   Wt 275 lb 9.6 oz (125 kg)   SpO2 97%   BMI 38.44 kg/m   Wt Readings from Last 3 Encounters:  08/06/16 275 lb 9.6 oz (125 kg)  07/17/16 282 lb 3.2 oz (128 kg)  07/05/15 261 lb 14.4 oz (118.8 kg)    Physical Exam  HENT:  Right Ear: Hearing and tympanic membrane normal.  Left Ear: Hearing and tympanic membrane normal.  Nose: Mucosal edema present. Right sinus exhibits no maxillary sinus tenderness and no frontal sinus tenderness. Left sinus exhibits no maxillary sinus tenderness and no frontal sinus tenderness.  Mouth/Throat: Oropharynx is clear and moist and mucous membranes are normal. No posterior oropharyngeal edema or posterior oropharyngeal erythema.  Pulmonary/Chest: He has no decreased breath sounds. He has wheezes in the right lower field and the left lower field. He has no rhonchi. He has rales in the left lower field. Chest wall is dull to percussion (LLL). He exhibits no mass and no tenderness.  Lymphadenopathy:    He has cervical adenopathy.       Right  cervical: Superficial cervical adenopathy present.       Left cervical: Superficial cervical adenopathy present.   Results for orders placed or performed in visit on 07/17/16  COMPLETE METABOLIC PANEL WITH GFR  Result Value Ref Range   Sodium 138 135 - 146 mmol/L   Potassium 3.8 3.5 - 5.3 mmol/L   Chloride 103 98 - 110 mmol/L   CO2 29 20 - 31 mmol/L   Glucose, Bld 86 65 - 99 mg/dL   BUN 13 7 - 25 mg/dL   Creat 0.860.89 5.780.60 - 4.691.35 mg/dL   Total Bilirubin 0.9 0.2 - 1.2 mg/dL   Alkaline Phosphatase 79 40 - 115 U/L   AST 15 10 - 40 U/L   ALT 13 9 - 46 U/L   Total Protein 6.5 6.1 - 8.1 g/dL   Albumin 4.1 3.6 - 5.1 g/dL   Calcium 9.0 8.6 - 62.910.3 mg/dL   GFR, Est African American >89 >=60 mL/min   GFR, Est Non African American >89 >=60 mL/min  Lipid Profile  Result Value Ref Range   Cholesterol 147 125 - 200 mg/dL   Triglycerides 528195 (H) <150 mg/dL   HDL 34 (L) >=41>=40 mg/dL   Total CHOL/HDL Ratio 4.3 <=5.0 Ratio   VLDL 39 (H) <30 mg/dL   LDL Cholesterol 74 <324<130 mg/dL  Hemoglobin M0NA1c  Result Value Ref Range  Hgb A1c MFr Bld 4.7 <5.7 %   Mean Plasma Glucose 88 mg/dL      Assessment & Plan:   Problem List Items Addressed This Visit    None    Visit Diagnoses    Cough    -  Primary   Likely 2/2 bronchitis but will rule out pnuemonia 2/2 crackles and consolidation. Prednisone and albuterol. Supportive care at home. Alarm symptoms reviewed. Re   Relevant Medications   predniSONE (DELTASONE) 20 MG tablet   albuterol (PROVENTIL HFA;VENTOLIN HFA) 108 (90 Base) MCG/ACT inhaler   azithromycin (ZITHROMAX) 250 MG tablet   benzonatate (TESSALON) 100 MG capsule   Other Relevant Orders   DG Chest 2 View   Upper respiratory infection       Supportive care. Alarm symptoms reviewed. Return if not improving.    Relevant Medications   azithromycin (ZITHROMAX) 250 MG tablet   Lung consolidation (HCC)       LLL- will XR to r/o pneumonia.    Relevant Medications   azithromycin (ZITHROMAX) 250  MG tablet   Other Relevant Orders   DG Chest 2 View      Meds ordered this encounter  Medications  . predniSONE (DELTASONE) 20 MG tablet    Sig: Take 2 tablets (40 mg total) by mouth daily with breakfast.    Dispense:  10 tablet    Refill:  0    Order Specific Question:   Supervising Provider    Answer:   Janeann Forehand [phone]  . albuterol (PROVENTIL HFA;VENTOLIN HFA) 108 (90 Base) MCG/ACT inhaler    Sig: Inhale 2 puffs into the lungs every 6 (six) hours as needed for wheezing or shortness of breath.    Dispense:  1 Inhaler    Refill:  0    Order Specific Question:   Supervising Provider    Answer:   Janeann Forehand [[id number]]  . azithromycin (ZITHROMAX) 250 MG tablet    Sig: Take 2 pills today and 1 pill daily until bottle empty.    Dispense:  6 tablet    Refill:  0    Order Specific Question:   Supervising Provider    Answer:   Janeann Forehand [[id number]]  . benzonatate (TESSALON) 100 MG capsule    Sig: Take 1 capsule (100 mg total) by mouth 3 (three) times daily as needed.    Dispense:  30 capsule    Refill:  0    Order Specific Question:   Supervising Provider    Answer:   Janeann Forehand [[id number]]      Follow up plan: Return if symptoms worsen or fail to improve.

## 2016-11-07 ENCOUNTER — Ambulatory Visit: Payer: BC Managed Care – PPO | Admitting: Family Medicine

## 2016-11-12 ENCOUNTER — Encounter: Payer: Self-pay | Admitting: Family Medicine

## 2016-11-12 ENCOUNTER — Ambulatory Visit (INDEPENDENT_AMBULATORY_CARE_PROVIDER_SITE_OTHER): Payer: BC Managed Care – PPO | Admitting: Family Medicine

## 2016-11-12 ENCOUNTER — Ambulatory Visit
Admission: RE | Admit: 2016-11-12 | Discharge: 2016-11-12 | Disposition: A | Discharge status: Home / Self Care | Payer: BC Managed Care – PPO | Source: Ambulatory Visit | Attending: Family Medicine | Admitting: Family Medicine

## 2016-11-12 VITALS — BP 138/80 | HR 66 | Temp 97.8°F | Resp 16 | Ht 71.0 in | Wt 284.6 lb

## 2016-11-12 DIAGNOSIS — L409 Psoriasis, unspecified: Secondary | ICD-10-CM | POA: Diagnosis not present

## 2016-11-12 DIAGNOSIS — M25561 Pain in right knee: Secondary | ICD-10-CM

## 2016-11-12 DIAGNOSIS — M25461 Effusion, right knee: Secondary | ICD-10-CM

## 2016-11-12 MED ORDER — NAPROXEN 500 MG PO TABS: 500.0000 mg | ORAL_TABLET | Freq: Two times a day (BID) | ORAL | 1 refills | Status: DC

## 2016-11-12 NOTE — Progress Notes (Signed)
Subjective:    Patient ID: Maurice Rogers, male    DOB: 11/21/1971, 45 y.o.   MRN: [id number]  Maurice Rogers is a 45 y.o. male presenting on 11/12/2016 for Knee Pain (right knee getting worst)   HPI    RIGHT KNEE PAIN and SWELLING, Chronic: - Reports initial onset of symptom with R knee pain and swelling about 4 months ago in August 2017, without any known inciting injury or trauma. He does not have a chronic history of knee pain and no prior knee problems or surgery. At time of onset he experienced acute onset "tightening" pain in muscles around knee and knee pain, initially 7/10 aching pain at worst, then better days 4/10 pain, describes tightening around knee worse with prolonged sitting and standing has stiffness and pain eases with walking and stretching it out, pain worse with knee flexion. Now worsening over past 1 week, difficulty sleeping at times due to knee pain. 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 Smoking status: Former Smoker    Quit date: 12/08/2000  . Smokeless tobacco: Never Used  . Alcohol use No    Review of Systems Per HPI unless specifically indicated above     Objective:    BP 138/80 (BP Location: Left Arm, Cuff Size: Normal)   Pulse 66   Temp 97.8 F (36.6 C) (Oral)   Resp 16   Ht 5\' 11"  (1.803 m)   Wt 284 lb 9.6 oz (129.1 kg)   BMI 39.69 kg/m   Wt Readings from Last 3 Encounters:  11/12/16 284 lb 9.6 oz (129.1 kg)  08/06/16 275 lb 9.6 oz (125 kg)    07/17/16 282 lb 3.2 oz (128 kg)    Physical Exam  Constitutional: He appears well-developed and well-nourished. No distress.  Well-appearing, comfortable, cooperative  HENT:  Head: Normocephalic and atraumatic.  Mouth/Throat: Oropharynx is clear and moist.  Cardiovascular: Normal rate and intact distal pulses.   Pulmonary/Chest: Effort normal.  Musculoskeletal:  Right Knee Inspection: Slightly bulkier appearance with mild joint effusion increased medially. Skin over patellar with dry rough patch consistent with psoriasis, bilateral knees. Palpation: Only mild tender +TTP medial joint line Right knee only. Significant fine crepitus bilateral (R>L) ROM: R knee flexion normal to about 90* then difficulty with pain and limited by swelling. Extension mostly normal. Special Testing: Lachman / Valgus/Varus tests negative with intact ligaments (ACL, MCL, LCL). McMurray negative without meniscus symptoms. Standing Thessaly negative for meniscus pain. Strength: 5/5 intact knee flex/ext, ankle dorsi/plantarflex Neurovascular: distally intact sensation light touch and pulses  Gait is mostly normal today, without significant limp or antalgic gait  Neurological: He is alert.  Skin: Skin is warm and dry. Rash (psoriasis patches over bilateral elbows and knees) noted. He is not diaphoretic. No erythema.  Nursing note and vitals reviewed.  I have personally reviewed the following lab results from 07/17/16.  Results for orders placed or performed in visit on 07/17/16  COMPLETE METABOLIC PANEL WITH GFR  Result Value Ref Range  Sodium 138 135 - 146 mmol/L   Potassium 3.8 3.5 - 5.3 mmol/L   Chloride 103 98 - 110 mmol/L   CO2 29 20 - 31 mmol/L   Glucose, Bld 86 65 - 99 mg/dL   BUN 13 7 - 25 mg/dL   Creat 1.610.89 0.960.60 - 0.451.35 mg/dL   Total Bilirubin 0.9 0.2 - 1.2 mg/dL   Alkaline Phosphatase 79 40 - 115 U/L   AST 15 10 - 40 U/L   ALT 13 9 - 46 U/L   Total Protein 6.5 6.1 - 8.1 g/dL   Albumin 4.1 3.6  - 5.1 g/dL   Calcium 9.0 8.6 - 40.910.3 mg/dL   GFR, Est African American >89 >=60 mL/min   GFR, Est Non African American >89 >=60 mL/min  Lipid Profile  Result Value Ref Range   Cholesterol 147 125 - 200 mg/dL   Triglycerides 811195 (H) <150 mg/dL   HDL 34 (L) >=91>=40 mg/dL   Total CHOL/HDL Ratio 4.3 <=5.0 Ratio   VLDL 39 (H) <30 mg/dL   LDL Cholesterol 74 <478<130 mg/dL  Hemoglobin G9FA1c  Result Value Ref Range   Hgb A1c MFr Bld 4.7 <5.7 %   Mean Plasma Glucose 88 mg/dL      Assessment & Plan:   Problem List Items Addressed This Visit    Right medial knee pain - Primary    Subacute worsening on chronic (4 months only) R knee medial pain and swelling without known injury or trauma. No prior known knee OA/DJD. 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 Start anti-inflammatory trial with rx Naprosyn 500mg  BID wc x 2-4 weeks, then PRN 2. Start Tylenol 500-1000mg  per dose TID PRN breakthrough 3. RICE therapy (rest, ice, compression, elevation) for swelling, activity modification, knee sleeve AND patellar strap 4. Knee x-rays ordered to evaluate DJD 5. Follow-up 4 weeks, if still worsening, consider muscle relaxant if night-time pain and spasms, steroid injection and referral to Ortho vs Sports Med for further eval       Relevant Medications   naproxen (NAPROSYN) 500 MG tablet   Other Relevant Orders   DG Knee Complete 4 Views Right   Psoriasis    Stable, chronic problem with skin manifestation of plaques on elbows and knees, treated with topical steroids - No known diagnosis of psoriatic arthritis, no other joint pain or  swelling       Other Visit Diagnoses    Effusion of bursa of right knee       Secondary response of inflammation with likely PFPS vs DJD knee, no evidence of infection.   Relevant Orders   DG Knee Complete 4 Views Right      Meds ordered this encounter  Medications  . naproxen (NAPROSYN) 500 MG tablet    Sig: Take 1 tablet (500 mg total) by mouth 2 (two) times daily with a meal. For 2-4 weeks then as needed    Dispense:  60 tablet    Refill:  1      Follow up plan: Return in about 4 weeks (around 12/10/2016) for Right knee pain.  Saralyn PilarAlexander Tnya Ades, DO [facility]  11/12/2016, 6:14 PM

## 2016-11-12 NOTE — Assessment & Plan Note (Signed)
Stable, chronic problem with skin manifestation of plaques on elbows and knees, treated with topical steroids - No known diagnosis of psoriatic arthritis, no other joint pain or swelling

## 2016-11-12 NOTE — Patient Instructions (Addendum)
Thank you for coming in to clinic today.  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 - Possible to have osteoarthritis or degenerative arthritis in your knee from chronic history of bending and kneeling  I do not think you have a torn ligament, or meniscus  - X-rays today, I will send results to MyChart with information once we receive it, likely within 24 hours  Recommend trial of Anti-inflammatory with Naproxen (Naprosyn) 500mg  tabs - take one with food and plenty of water TWICE daily every day (breakfast and dinner), for next 2 to 4 weeks, then you may take only as needed - DO NOT TAKE any ibuprofen, aleve, motrin while you are taking this medicine - It is safe to take Tylenol Ext Str 500mg  tabs - take 1 to 2 (max dose 1000mg ) every 6 hours as needed for breakthrough pain, max 24 hour daily dose is 6 to 8 tablets or 4000mg   Use RICE therapy: - R - Rest / relative rest with activity modification avoid overuse and frequent bending or pressure on bent knee - I - Ice packs (make sure you use a towel or something to protect skin - C - Compression for Knee Sleeve to help reduce swelling and allow support, also I do think it may be worth while to ALSO try a KNEE PATELLAR STRAP, pictured below, it sits directly below your patella or kneecap on the tendon and allows support with patellar movement, which I think is your problem - E - Elevation - if significant swelling, lift leg above heart level to allow improved reduced swelling.     In future can discuss physical therapy, steroid injection, or referral to orthopedics/sports medicine  Please schedule a follow-up appointment with Dr. Althea CharonKaramalegos in 4-6 weeks to follow-up Right knee pain  If you have any other questions or concerns, please feel free to call the clinic or send a message through MyChart. You may also schedule an earlier appointment if  necessary.  Saralyn PilarAlexander Cirilo Canner, DO [facility]    You can stretch your leg right away by doing the first 3 stretching exercises. Start strengthening your leg by doing the last 3 exercises.  Hamstring stretch on wall: Lie on your back with your buttocks close to a doorway, and extend your legs straight out in front of you along the floor. Raise the injured leg and rest it against the wall next to the door frame. Your other leg should extend through the doorway. You should feel a stretch in the back of your thigh. Hold this position for 15 to 30 seconds. Repeat 3 times.  Standing calf stretch: Facing a wall, put your hands against the wall at about eye level. Keep the injured leg back, the uninjured leg forward, and the heel of your injured leg on the floor. Turn your injured foot slightly inward (as if you were pigeon-toed) as you slowly lean into the wall until you feel a stretch in the back of your calf. Hold for 15 to 30 seconds. Repeat 3 times. Do this exercise several times each day.  Quadriceps stretch: Stand an arm's length away from the wall, facing straight ahead. Brace yourself by keeping the hand on the uninjured side against the wall. With your other hand, grasp the ankle of the injured leg and pull your heel toward your buttocks. Don't arch or twist your back and keep  your knees together. Hold this stretch for 15 to 30 seconds. Repeat 3 times.  Quadriceps isometrics: Sitting on the floor with your injured leg straight and your other leg bent, press the back of your knee into the floor by tightening the muscles on the top of your thigh. Hold this position 10 seconds. Relax. Do 3 sets of 10.  Heel slide: Sit on a firm surface with your legs straight in front of you. Slowly slide the heel of your injured leg toward your buttock by pulling your knee to your chest as you slide. Return to the starting position. Do 3 sets of 10.  Hamstring isometrics: Sitting on the  floor with the injured leg slightly bent, dig the heel of your injured leg into the floor and tighten up the back of your thigh muscles. Hold this position for 5 seconds. Do 3 sets of 10.

## 2016-11-12 NOTE — Assessment & Plan Note (Addendum)
Subacute worsening on chronic (4 months only) R knee medial pain and swelling without known injury or trauma. No prior known knee OA/DJD. 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 Start anti-inflammatory trial with rx Naprosyn 500mg  BID wc x 2-4 weeks, then PRN 2. Start Tylenol 500-1000mg  per dose TID PRN breakthrough 3. RICE therapy (rest, ice, compression, elevation) for swelling, activity modification, knee sleeve AND patellar strap 4. Knee x-rays ordered to evaluate DJD 5. Follow-up 4 weeks, if still worsening, consider muscle relaxant if night-time pain and spasms, steroid injection and referral to Ortho vs Sports Med for further eval

## 2016-12-04 ENCOUNTER — Ambulatory Visit (INDEPENDENT_AMBULATORY_CARE_PROVIDER_SITE_OTHER): Payer: BC Managed Care – PPO | Admitting: Family Medicine

## 2016-12-04 ENCOUNTER — Encounter: Payer: Self-pay | Admitting: Family Medicine

## 2016-12-04 VITALS — BP 141/81 | HR 80 | Temp 98.6°F | Resp 16 | Ht 71.0 in | Wt 284.0 lb

## 2016-12-04 DIAGNOSIS — L409 Psoriasis, unspecified: Secondary | ICD-10-CM | POA: Diagnosis not present

## 2016-12-04 DIAGNOSIS — M25461 Effusion, right knee: Secondary | ICD-10-CM

## 2016-12-04 DIAGNOSIS — M25561 Pain in right knee: Secondary | ICD-10-CM

## 2016-12-04 NOTE — Patient Instructions (Addendum)
Thank you for coming in to clinic today.  Urgent referral placed to Ortho today  [facility] (formerly [facility] Address: [address], [place], [place] [address] Hours:  9AM-5PM Phone: [phone]  Allena KatzMaurice Jones Jr, PA-C   Recommend trial of Anti-inflammatory with Naproxen (Naprosyn) 500mg  tabs - take one with food and plenty of water TWICE daily every day (breakfast and dinner), for next 2 to 4 weeks, then you may take only as needed - DO NOT TAKE any ibuprofen, aleve, motrin while you are taking this medicine - It is safe to take Tylenol Ext Str 500mg  tabs - take 1 to 2 (max dose 1000mg ) every 6 hours as needed for breakthrough pain, max 24 hour daily dose is 6 to 8 tablets or 4000mg   Please schedule a follow-up appointment with Dr. Althea CharonKaramalegos in 4-6 weeks to follow-up Right knee pain  If you have any other questions or concerns, please feel free to call the clinic or send a message through MyChart. You may also schedule an earlier appointment if necessary.  Saralyn PilarAlexander Morton Simson, DO [facility]

## 2016-12-04 NOTE — Assessment & Plan Note (Signed)
Persisetnt subacute worsening on chronic >5 months now R knee medial pain and swelling without known injury or trauma. 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 Concern with knee flexion overuse, had thought PFPS but not responding as expected to conservative therapy. - Prior exam not supportive of meniscus or ligament injury - Able to bear weight, no knee instability or mechanical locking - No prior history of knee surgery, arthroscopy - Not responding to conservative therapy - Unlikely psoriatic arthritis, but can consider in future if needed  Plan: 1. Referral to [facility] for further evaluation R knee pain and swelling, still with knee effusion today, no evidence of erythema or complication, however placed urgent referral for knee aspiration and fluid studies along with further work-up, likely pain improved with reduced swelling, unclear exact etiology of swelling other than DJD 2. Continue Naproxen 500mg  BID until see ortho, may take Tylenol PRN breakthrough 3. Continue RICE therapy if helping for swelling, activity modification, knee sleeve as needed 4. Defer further management and imaging to ortho at this point, in future if needed can consider steroid injections here, but would recommend further diagnostics first 5. Follow-up as needed, after sees ortho

## 2016-12-04 NOTE — Progress Notes (Signed)
Subjective:    Patient ID: Maurice Rogers, male    DOB: 1971/01/09, 45 y.o.   MRN: [id number]  Maurice CairoBently C Debruyn is a 45 y.o. male presenting on 12/04/2016 for Knee Pain (Right)   HPI    RIGHT KNEE PAIN and SWELLING, Chronic: - Last visit 11/12/16 for same complaint, see note for background history, thought to be PFPS, had X-ray showed mild DJD patellofemoral and medial compartment, consistent with symptoms - Reports overall he is not improved and essentially knee pain and swelling is same as last visit. Describes only mild improvement during day on naproxen (still taking 500mg  BID with some relief) taking tylenol, seems improved during the day with more movement and activity, worsening with staying seated or sleeping will get stiff and more pain in morning, describes a sharp stabbing pain severe 6-7/10, but then later with activity and improvement more of an aching pain 4-5/10 like before. - Also admits some pain at night difficulty falling asleep and sometimes will wake him up - Using compression sleeve, patellar strap and ice packs, without significant relief - Admits associated persistent R knee swelling, worse medial, without relief with elevation - No known history of arthritis elsewhere - Denies any fevers/chills, numbness, tingling, weakness, redness  PMH Psoriasis but never had joint involvement  Social History  Substance Use Topics  . Smoking status: Former Smoker    Quit date: 12/08/2000  . Smokeless tobacco: Never Used  . Alcohol use No    Review of Systems Per HPI unless specifically indicated above     Objective:    BP (!) 141/81   Pulse 80   Temp 98.6 F (37 C) (Oral)   Resp 16   Ht 5\' 11"  (1.803 m)   Wt 284 lb (128.8 kg)   BMI 39.61 kg/m   Wt Readings from Last 3 Encounters:  12/04/16 284 lb (128.8 kg)  11/12/16 284 lb 9.6 oz (129.1 kg)  08/06/16 275 lb 9.6 oz (125 kg)    Physical Exam  Constitutional: He appears well-developed and well-nourished. No  distress.  Well-appearing, comfortable, cooperative  HENT:  Head: Normocephalic and atraumatic.  Mouth/Throat: Oropharynx is clear and moist.  Cardiovascular: Normal rate and intact distal pulses.   Pulmonary/Chest: Effort normal.  Musculoskeletal:  Right Knee - Essentially stable since last exam 12/6 Inspection: Slightly bulkier appearance with mild to moderate joint effusion increased medially. Skin over patellar with dry rough patch consistent with psoriasis, bilateral knees. Palpation: Mild tender +TTP medial joint line Right knee only. Significant fine crepitus bilateral (R>L) ROM: R knee flexion normal to about 90* then difficulty with pain and limited by swelling. Extension mostly normal. Special Testing: Lachman / Valgus/Varus tests negative with intact ligaments (ACL, MCL, LCL). - Did not repeat meniscus testing today, last exam McMurray negative without meniscus symptoms. Standing Thessaly negative for meniscus pain. Strength: 5/5 intact knee flex/ext, ankle dorsi/plantarflex Neurovascular: distally intact sensation light touch and pulses  Gait is mostly normal today, without significant limp or antalgic gait  Neurological: He is alert.  Skin: Skin is warm and dry. Rash (Mildly improved psoriasis patches over bilateral elbows and knees) noted. He is not diaphoretic. No erythema.  Nursing note and vitals reviewed.  I have personally reviewed the following lab results from 07/17/16.  Results for orders placed or performed in visit on 07/17/16  COMPLETE METABOLIC PANEL WITH GFR  Result Value Ref Range   Sodium 138 135 - 146 mmol/L   Potassium 3.8 3.5 - 5.3  mmol/L   Chloride 103 98 - 110 mmol/L   CO2 29 20 - 31 mmol/L   Glucose, Bld 86 65 - 99 mg/dL   BUN 13 7 - 25 mg/dL   Creat 0.980.89 1.190.60 - 1.471.35 mg/dL   Total Bilirubin 0.9 0.2 - 1.2 mg/dL   Alkaline Phosphatase 79 40 - 115 U/L   AST 15 10 - 40 U/L   ALT 13 9 - 46 U/L   Total Protein 6.5 6.1 - 8.1 g/dL   Albumin 4.1 3.6 -  5.1 g/dL   Calcium 9.0 8.6 - 82.910.3 mg/dL   GFR, Est African American >89 >=60 mL/min   GFR, Est Non African American >89 >=60 mL/min  Lipid Profile  Result Value Ref Range   Cholesterol 147 125 - 200 mg/dL   Triglycerides 562195 (H) <150 mg/dL   HDL 34 (L) >=13>=40 mg/dL   Total CHOL/HDL Ratio 4.3 <=5.0 Ratio   VLDL 39 (H) <30 mg/dL   LDL Cholesterol 74 <086<130 mg/dL  Hemoglobin [id number]  Result Value Ref Range   Hgb A1c MFr Bld 4.7 <5.7 %   Mean Plasma Glucose 88 mg/dL      Assessment & Plan:   Problem List Items Addressed This Visit    Right medial knee pain - Primary    Persisetnt subacute worsening on chronic >5 months now R knee medial pain and swelling without known injury or trauma. 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 Concern with knee flexion overuse, had thought PFPS but not responding as expected to conservative therapy. - Prior exam not supportive of meniscus or ligament injury - Able to bear weight, no knee instability or mechanical locking - No prior history of knee surgery, arthroscopy - Not responding to conservative therapy - Unlikely psoriatic arthritis, but can consider in future if needed  Plan: 1. Referral to [facility] for further evaluation R knee pain and swelling, still with knee effusion today, no evidence of erythema or complication, however placed urgent referral for knee aspiration and fluid studies along with further work-up, likely pain improved with reduced swelling, unclear exact etiology of swelling other than DJD 2. Continue Naproxen 500mg  BID until see ortho, may take Tylenol PRN breakthrough 3. Continue RICE therapy if helping for swelling, activity modification, knee sleeve as needed 4. Defer further management and imaging to ortho at this point, in future if needed can consider steroid injections here, but would recommend further diagnostics first 5. Follow-up as needed, after sees ortho       Relevant Orders   Ambulatory referral to Orthopedic Surgery   Psoriasis    Stable, chronic problem with skin manifestation of plaques on elbows and knees, treated with topical steroids - No known diagnosis of psoriatic arthritis, no other joint pain or swelling       Other Visit Diagnoses    Swelling of right knee joint       Relevant Orders   Ambulatory referral to Orthopedic Surgery      No orders of the defined types were placed in this encounter.     Follow up plan: Return in about 6 weeks (around 01/15/2017) for for R knee pain swelling.  Saralyn PilarAlexander Karamalegos, DO [facility] 12/04/2016, 12:02 PM

## 2016-12-04 NOTE — Assessment & Plan Note (Signed)
Stable, chronic problem with skin manifestation of plaques on elbows and knees, treated with topical steroids - No known diagnosis of psoriatic arthritis, no other joint pain or swelling 

## 2016-12-11 ENCOUNTER — Other Ambulatory Visit: Payer: Self-pay | Admitting: Family Medicine

## 2016-12-11 ENCOUNTER — Encounter: Payer: Self-pay | Admitting: Family Medicine

## 2016-12-11 DIAGNOSIS — M1711 Unilateral primary osteoarthritis, right knee: Secondary | ICD-10-CM

## 2016-12-11 DIAGNOSIS — M25561 Pain in right knee: Secondary | ICD-10-CM

## 2016-12-11 MED ORDER — MELOXICAM 15 MG PO TABS: 15.0000 mg | ORAL_TABLET | Freq: Every day | ORAL | 0 refills | Status: DC

## 2017-05-22 ENCOUNTER — Encounter: Payer: Self-pay | Admitting: Family Medicine

## 2017-05-22 ENCOUNTER — Ambulatory Visit (INDEPENDENT_AMBULATORY_CARE_PROVIDER_SITE_OTHER): Payer: BC Managed Care – PPO | Admitting: Family Medicine

## 2017-05-22 VITALS — BP 137/79 | HR 58 | Temp 97.9°F | Resp 16 | Ht 71.0 in | Wt 296.0 lb

## 2017-05-22 DIAGNOSIS — R710 Precipitous drop in hematocrit: Secondary | ICD-10-CM | POA: Diagnosis not present

## 2017-05-22 DIAGNOSIS — D649 Anemia, unspecified: Secondary | ICD-10-CM

## 2017-05-22 DIAGNOSIS — R7989 Other specified abnormal findings of blood chemistry: Secondary | ICD-10-CM

## 2017-05-22 LAB — CBC WITH DIFFERENTIAL/PLATELET
BASOS PCT: 0 %
Basophils Absolute: 0 cells/uL (ref 0–200)
Eosinophils Absolute: 171 cells/uL (ref 15–500)
Eosinophils Relative: 3 %
HCT: 42.5 % (ref 38.5–50.0)
Hemoglobin: 14.1 g/dL (ref 13.2–17.1)
LYMPHS PCT: 33 %
Lymphs Abs: 1881 cells/uL (ref 850–3900)
MCH: 26.5 pg — ABNORMAL LOW (ref 27.0–33.0)
MCHC: 33.2 g/dL (ref 32.0–36.0)
MCV: 79.7 fL — ABNORMAL LOW (ref 80.0–100.0)
MONO ABS: 342 {cells}/uL (ref 200–950)
MONOS PCT: 6 %
MPV: 9.5 fL (ref 7.5–12.5)
Neutro Abs: 3306 cells/uL (ref 1500–7800)
Neutrophils Relative %: 58 %
PLATELETS: 147 10*3/uL (ref 140–400)
RBC: 5.33 MIL/uL (ref 4.20–5.80)
RDW: 15.7 % — AB (ref 11.0–15.0)
WBC: 5.7 10*3/uL (ref 3.8–10.8)

## 2017-05-22 LAB — IRON AND TIBC
%SAT: 24 % (ref 15–60)
IRON: 87 ug/dL (ref 50–180)
TIBC: 361 ug/dL (ref 250–425)
UIBC: 274 ug/dL

## 2017-05-22 NOTE — Patient Instructions (Addendum)
Thank you for coming to the clinic today.  1. Will check all iron / hemoglobin / hematocrit labs today - will send results through MyChart with recommendations   You will be due for FASTING BLOOD WORK (no food or drink after midnight before, only water or coffee without cream/sugar on the morning of)  - Please go ahead and schedule a "Lab Only" visit in the morning at the clinic for lab draw in 2 months  - Make sure Lab Only appointment is at least 1-2 weeks before your next appointment, so that results will be available  For Lab Results, once available within 2-3 days of blood draw, you can can log in to MyChart online to view your results and a brief explanation. Also, we can discuss results at next follow-up visit.  Please schedule a Follow-up Appointment to: Return in about 2 months (around 07/22/2017) for Annual Physical.  If you have any other questions or concerns, please feel free to call the clinic or send a message through MyChart. You may also schedule an earlier appointment if necessary.  Additionally, you may be receiving a survey about your experience at our clinic within a few days to 1 week by e-mail or mail. We value your feedback.  Saralyn PilarAlexander Ming Kunka, DO [facility]

## 2017-05-22 NOTE — Progress Notes (Signed)
Subjective:    Patient ID: Maurice [facility]Bently C Campus, male    DOB: January 01, 1971, 46 y.o.   MRN: [id number]  Maurice Rogers is a 46 y.o. male presenting on 05/22/2017 for iron deficiency   HPI   ABNORMAL LAB VALUE / Low Hematocrit - Patient reports that he has [facility] [place], and last time he was evaluated for this in 12/2016 their tests showed he had a low hematocrit (38%, 37%, and 38%), he has a form today that requests additional medical examination by his doctor. This is a new problem to him. - Today he reports he waited until summer (5-6 months later) to come in for this since now he has time off as a Runner, broadcasting/film/videoteacher. He is not concerned about it but would like to do some more blood test to figure out what the problem is. He denies any symptoms of anemia, and has never been diagnosed with anything similar before. His last blood test for CBC was 2014 and unremarkable (normal Hgb, HCT). His last routine labs were 07/2016. - He does not take regular iron supplement or MVI only occasional - No known significant family history of iron deficiency or other anemia problems - Denies any exertional symptoms of chest pain or pressure, dyspnea, fatigue, malaise, pre-syncope or syncopal episode, palpitations, cold intolerance, pica, bruising or bleeding problem  Social History  Substance Use Topics  . Smoking status: Former Smoker    Quit date: 12/08/2000  . Smokeless tobacco: Never Used  . Alcohol use No    Review of Systems Per HPI unless specifically indicated above     Objective:    BP 137/79   Pulse (!) 58   Temp 97.9 F (36.6 C) (Oral)   Resp 16   Ht 5\' 11"  (1.803 m)   Wt 296 lb (134.3 kg)   BMI 41.28 kg/m   Wt Readings from Last 3 Encounters:  05/22/17 296 lb (134.3 kg)  12/04/16 284 lb (128.8 kg)  11/12/16 284 lb 9.6 oz (129.1 kg)    Physical Exam  Constitutional: He is oriented to person, place, and time. He appears well-developed and well-nourished.  No distress.  Well-appearing, comfortable, cooperative, overweight  HENT:  Head: Normocephalic and atraumatic.  Mouth/Throat: Oropharynx is clear and moist.  Eyes: Conjunctivae are normal. Pupils are equal, round, and reactive to light.  Lower eyelid conjunctiva are normal without paleness  Neck: Normal range of motion. Neck supple.  Cardiovascular: Normal rate, regular rhythm, normal heart sounds and intact distal pulses.   No murmur heard. Pulmonary/Chest: Effort normal and breath sounds normal. No respiratory distress. He has no wheezes. He has no rales.  Musculoskeletal: He exhibits no edema.  Neurological: He is alert and oriented to person, place, and time.  Skin: Skin is warm and dry. No rash noted. He is not diaphoretic. No erythema.  Psychiatric: He has a normal mood and affect. His behavior is normal.  Nursing note and vitals reviewed.  Results for orders placed or performed in visit on 07/17/16  COMPLETE METABOLIC PANEL WITH GFR  Result Value Ref Range   Sodium 138 135 - 146 mmol/L   Potassium 3.8 3.5 - 5.3 mmol/L   Chloride 103 98 - 110 mmol/L   CO2 29 20 - 31 mmol/L   Glucose, Bld 86 65 - 99 mg/dL   BUN 13 7 - 25 mg/dL   Creat 1.190.89 1.470.60 - 8.291.35 mg/dL   Total Bilirubin 0.9 0.2 - 1.2 mg/dL  Alkaline Phosphatase 79 40 - 115 U/L   AST 15 10 - 40 U/L   ALT 13 9 - 46 U/L   Total Protein 6.5 6.1 - 8.1 g/dL   Albumin 4.1 3.6 - 5.1 g/dL   Calcium 9.0 8.6 - 40.9 mg/dL   GFR, Est African American >89 >=60 mL/min   GFR, Est Non African American >89 >=60 mL/min  Lipid Profile  Result Value Ref Range   Cholesterol 147 125 - 200 mg/dL   Triglycerides 811 (H) <150 mg/dL   HDL 34 (L) >=91 mg/dL   Total CHOL/HDL Ratio 4.3 <=5.0 Ratio   VLDL 39 (H) <30 mg/dL   LDL Cholesterol 74 <478 mg/dL  Hemoglobin G9F  Result Value Ref Range   Hgb A1c MFr Bld 4.7 <5.7 %   Mean Plasma Glucose 88 mg/dL   CBC Latest Ref Rng & Units 03/29/2013 03/28/2013  WBC 3.8 - 10.6 x10 3/mm 3 4.6 3.9    Hemoglobin 13.0 - 18.0 g/dL 62.1 30.8  Hematocrit 65.7 - 52.0 % 44.5 40.9  Platelets 150 - 440 x10 3/mm 3 157 140(L)        Assessment & Plan:   Problem List Items Addressed This Visit    None    Visit Diagnoses    Low hematocrit    -  Primary   Relevant Orders   CBC with Differential/Platelet   Iron and TIBC   Ferritin   Abnormal CBC measurement       Relevant Orders   CBC with Differential/Platelet   Iron and TIBC   Ferritin  Asymptomatic currently, now 5 months later return for evaluation after reported low HCT. Clinically less consistent with significant anemia or other abnormality. No fam history.  Plan: 1. Discussion on potential factors for changing HCT with hydration status etc or may be lab error 2. Reassurance overall 3. Will check labs today non fasting, iron panel - CBC, Iron, Ferritin, TIBC - did not add retics but can add this in future if needed with other labs 4. Review results and contact patient with recommendations - will complete [facility] form and submit to them by fax or mail once complete  Also follow-up 8 weeks for annual physical other fasting labs, may end up re-checking CBC if indicated        Follow up plan: Return in about 2 months (around 07/22/2017) for Annual Physical.  Saralyn Pilar, DO [facility] 05/22/2017, 12:25 PM

## 2017-05-23 LAB — FERRITIN: Ferritin: 90 ng/mL (ref 20–380)

## 2017-05-25 ENCOUNTER — Other Ambulatory Visit: Payer: Self-pay | Admitting: Family Medicine

## 2017-05-25 DIAGNOSIS — R718 Other abnormality of red blood cells: Secondary | ICD-10-CM

## 2017-05-25 DIAGNOSIS — R799 Abnormal finding of blood chemistry, unspecified: Secondary | ICD-10-CM

## 2017-05-25 DIAGNOSIS — E782 Mixed hyperlipidemia: Secondary | ICD-10-CM

## 2017-05-25 DIAGNOSIS — E785 Hyperlipidemia, unspecified: Secondary | ICD-10-CM | POA: Insufficient documentation

## 2017-05-25 DIAGNOSIS — Z0000 Encounter for general adult medical examination without abnormal findings: Secondary | ICD-10-CM

## 2017-05-25 DIAGNOSIS — Z6839 Body mass index (BMI) 39.0-39.9, adult: Secondary | ICD-10-CM

## 2017-07-13 ENCOUNTER — Other Ambulatory Visit: Payer: BC Managed Care – PPO

## 2017-07-13 DIAGNOSIS — Z0000 Encounter for general adult medical examination without abnormal findings: Secondary | ICD-10-CM

## 2017-07-13 DIAGNOSIS — E782 Mixed hyperlipidemia: Secondary | ICD-10-CM

## 2017-07-13 DIAGNOSIS — R718 Other abnormality of red blood cells: Secondary | ICD-10-CM

## 2017-07-13 DIAGNOSIS — R799 Abnormal finding of blood chemistry, unspecified: Secondary | ICD-10-CM

## 2017-07-13 LAB — CBC WITH DIFFERENTIAL/PLATELET
Basophils Absolute: 0 cells/uL (ref 0–200)
Basophils Relative: 0 %
EOS PCT: 3 %
Eosinophils Absolute: 162 cells/uL (ref 15–500)
HCT: 44.6 % (ref 38.5–50.0)
HEMOGLOBIN: 14.9 g/dL (ref 13.2–17.1)
LYMPHS ABS: 1566 {cells}/uL (ref 850–3900)
Lymphocytes Relative: 29 %
MCH: 27.9 pg (ref 27.0–33.0)
MCHC: 33.4 g/dL (ref 32.0–36.0)
MCV: 83.4 fL (ref 80.0–100.0)
MPV: 10 fL (ref 7.5–12.5)
Monocytes Absolute: 270 cells/uL (ref 200–950)
Monocytes Relative: 5 %
NEUTROS PCT: 63 %
Neutro Abs: 3402 cells/uL (ref 1500–7800)
PLATELETS: 147 10*3/uL (ref 140–400)
RBC: 5.35 MIL/uL (ref 4.20–5.80)
RDW: 15.8 % — AB (ref 11.0–15.0)
WBC: 5.4 10*3/uL (ref 3.8–10.8)

## 2017-07-13 IMAGING — DX DG KNEE COMPLETE 4+V*R*
4 series · 4 of 4 positions shown · non-contrast
Comparison: No recent prior.

CLINICAL DATA: Knee pain.  No injury.

EXAM:
RIGHT KNEE - COMPLETE 4+ VIEW

[knee ap]
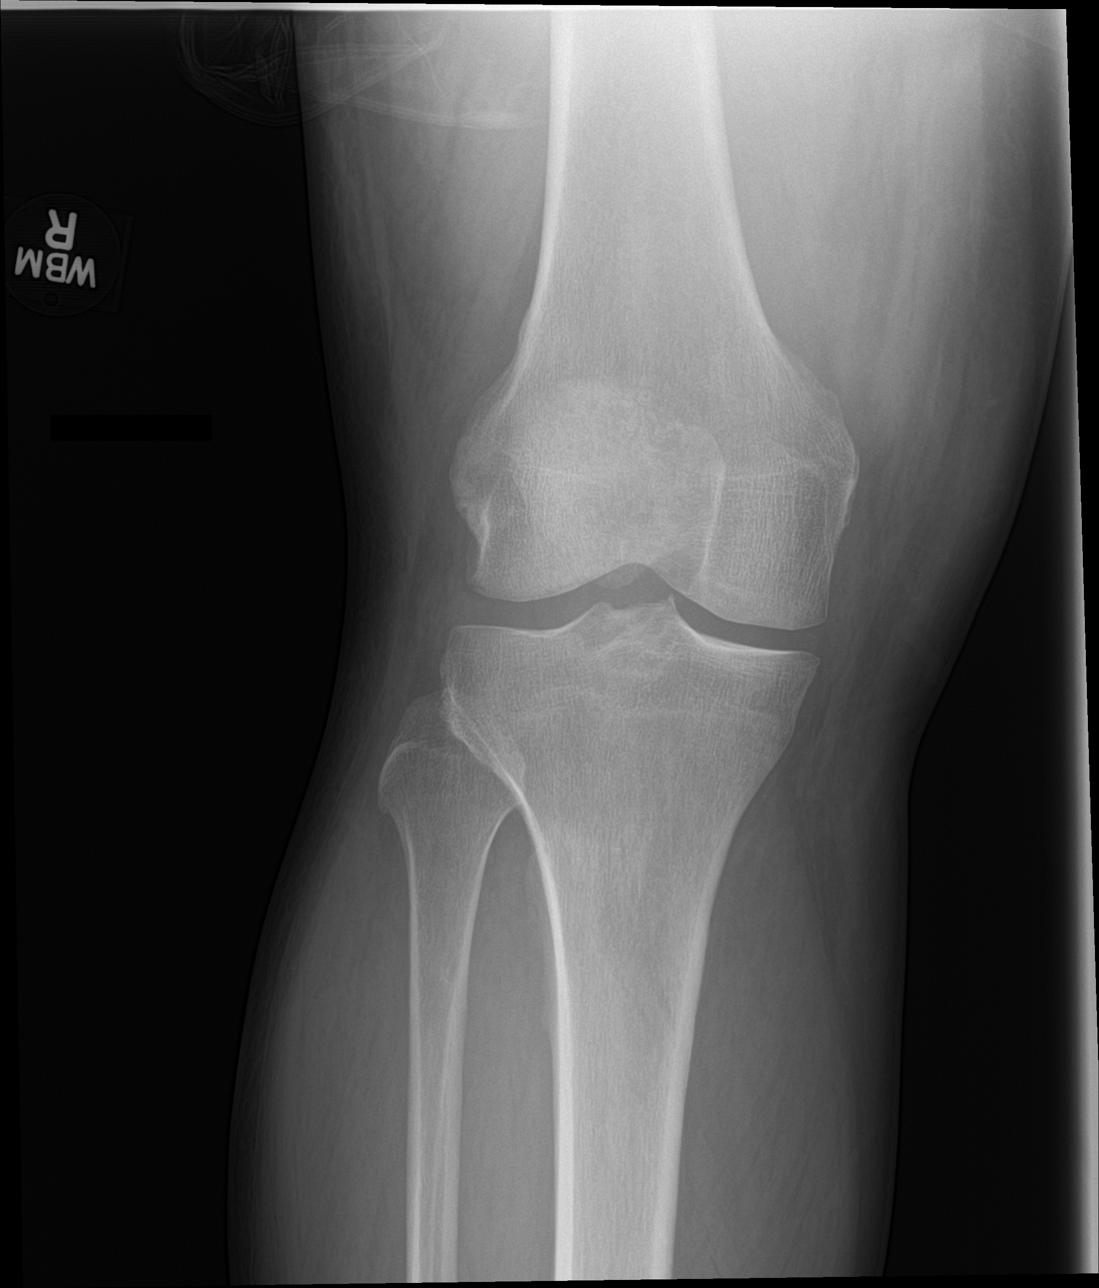

[knee tunnel]
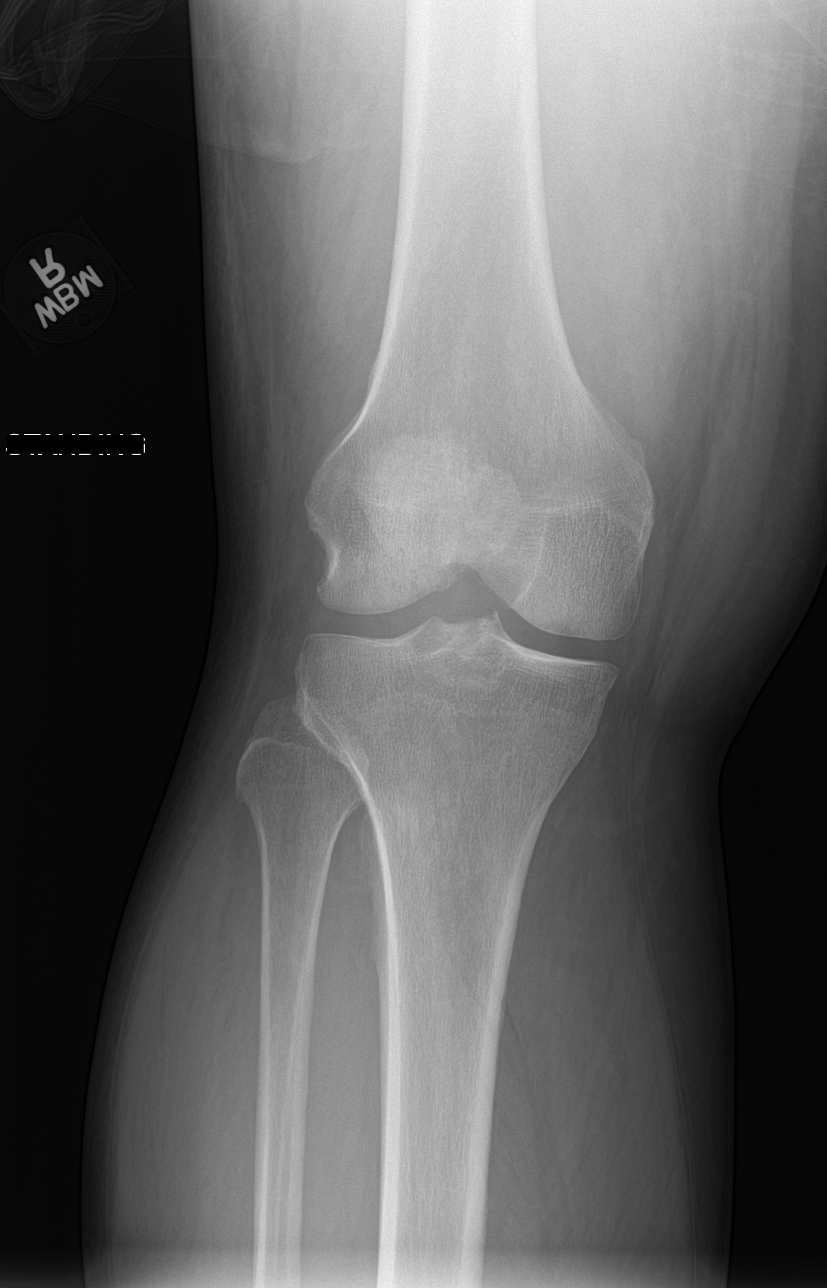

[knee lat]
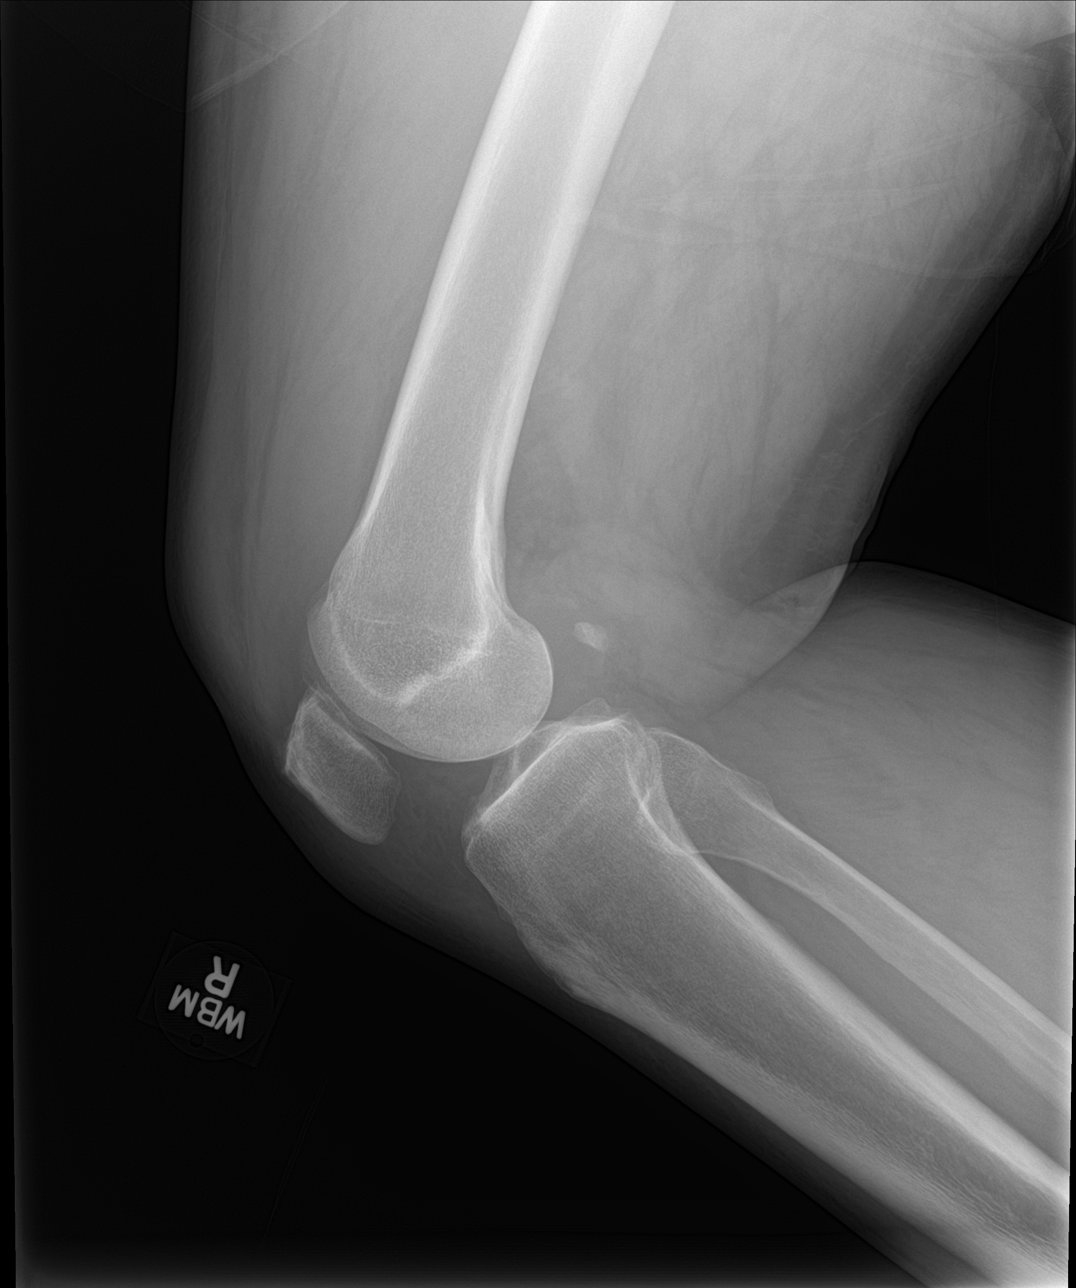

[patella skyline]
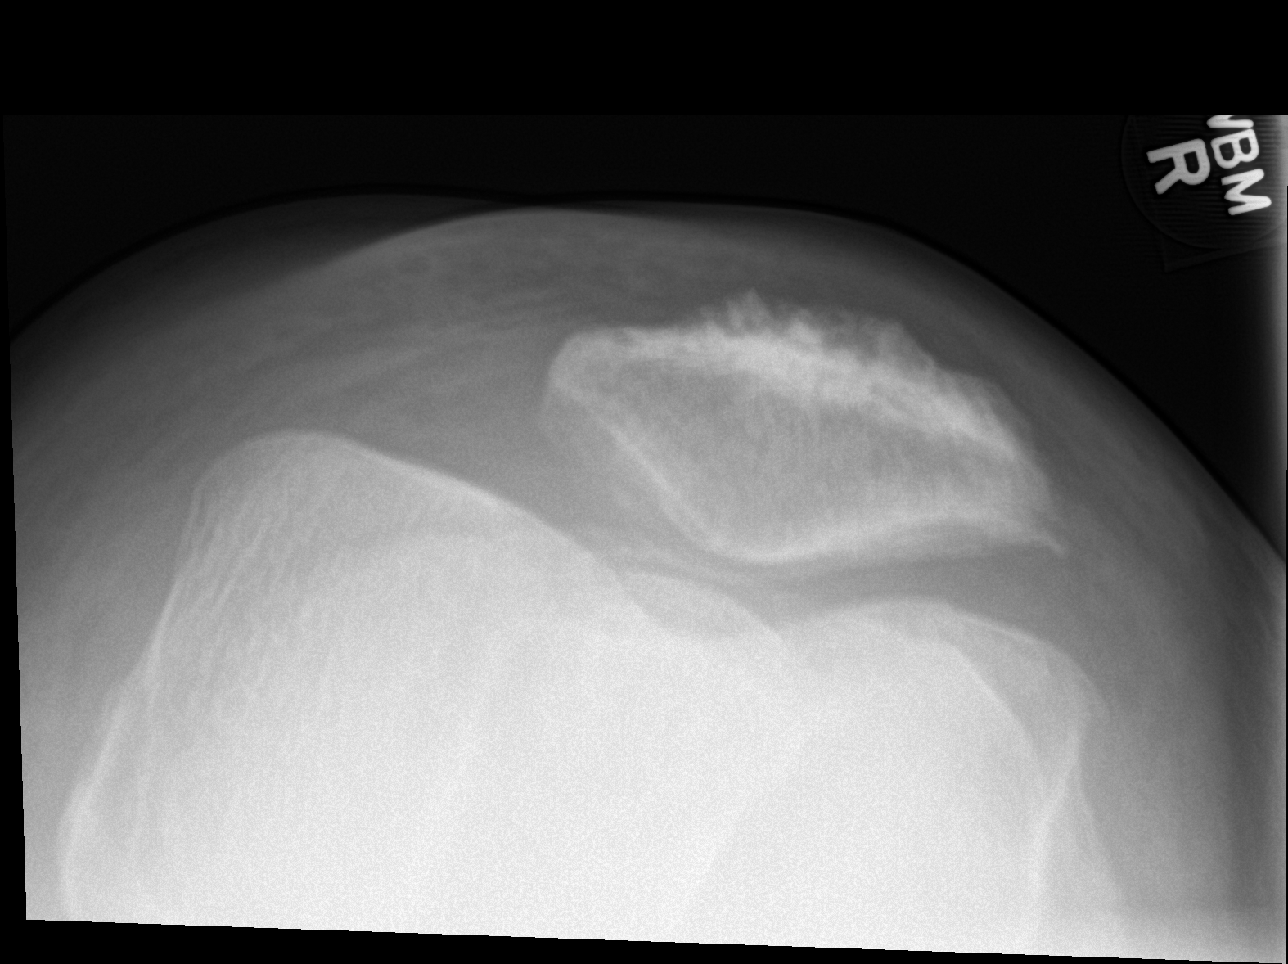

[4 of 4 positions shown; findings below may reference images not displayed]

FINDINGS: Small knee joint effusion cannot be excluded. Mild patellofemoral
and medial compartment degenerative change. No acute bony
abnormality identified.
IMPRESSION: 1. Small knee joint effusion cannot be excluded. No acute
abnormality.

2. Mild patellofemoral and medial compartment degenerative change.

## 2017-07-14 LAB — LIPID PANEL
CHOLESTEROL: 151 mg/dL (ref <200)
HDL: 34 mg/dL — ABNORMAL LOW (ref >40)
LDL Cholesterol: 84 mg/dL (ref <100)
TRIGLYCERIDES: 167 mg/dL — AB (ref <150)
Total CHOL/HDL Ratio: 4.4 Ratio (ref <5.0)
VLDL: 33 mg/dL — ABNORMAL HIGH (ref <30)

## 2017-07-14 LAB — HEMOGLOBIN A1C
Hgb A1c MFr Bld: 5.1 % (ref <5.7)
MEAN PLASMA GLUCOSE: 100 mg/dL

## 2017-07-14 LAB — COMPLETE METABOLIC PANEL WITH GFR
ALBUMIN: 4 g/dL (ref 3.6–5.1)
ALK PHOS: 61 U/L (ref 40–115)
ALT: 20 U/L (ref 9–46)
AST: 19 U/L (ref 10–40)
BILIRUBIN TOTAL: 0.7 mg/dL (ref 0.2–1.2)
BUN: 8 mg/dL (ref 7–25)
CO2: 26 mmol/L (ref 20–32)
CREATININE: 0.76 mg/dL (ref 0.60–1.35)
Calcium: 8.8 mg/dL (ref 8.6–10.3)
Chloride: 104 mmol/L (ref 98–110)
GFR, Est African American: >89 mL/min (ref >=60)
GLUCOSE: 113 mg/dL — AB (ref 65–99)
Potassium: 4.2 mmol/L (ref 3.5–5.3)
SODIUM: 137 mmol/L (ref 135–146)
TOTAL PROTEIN: 6.3 g/dL (ref 6.1–8.1)

## 2017-07-20 ENCOUNTER — Ambulatory Visit (INDEPENDENT_AMBULATORY_CARE_PROVIDER_SITE_OTHER): Payer: BC Managed Care – PPO | Admitting: Family Medicine

## 2017-07-20 ENCOUNTER — Encounter: Payer: Self-pay | Admitting: Family Medicine

## 2017-07-20 VITALS — BP 138/84 | HR 58 | Temp 98.5°F | Resp 16 | Ht 71.0 in | Wt 302.0 lb

## 2017-07-20 DIAGNOSIS — R030 Elevated blood-pressure reading, without diagnosis of hypertension: Secondary | ICD-10-CM | POA: Diagnosis not present

## 2017-07-20 DIAGNOSIS — E782 Mixed hyperlipidemia: Secondary | ICD-10-CM | POA: Diagnosis not present

## 2017-07-20 DIAGNOSIS — Z6841 Body Mass Index (BMI) 40.0 and over, adult: Secondary | ICD-10-CM | POA: Diagnosis not present

## 2017-07-20 DIAGNOSIS — I10 Essential (primary) hypertension: Secondary | ICD-10-CM | POA: Insufficient documentation

## 2017-07-20 DIAGNOSIS — Z0000 Encounter for general adult medical examination without abnormal findings: Secondary | ICD-10-CM

## 2017-07-20 NOTE — Assessment & Plan Note (Signed)
Mildly elevated initial BP, repeat manual check improved, still borderline SBP 138. - Home BP readings none  No known complications   Plan:  1. Never on treatment - agree to hold for now, discuss concern may progress to dx HTN. Need more data. 2. Encourage improved lifestyle - low sodium diet, regular exercise 3. Start monitor BP outside office, bring readings to next visit, if persistently >138/88 or new symptoms notify office sooner 4. Follow-up q 6 months

## 2017-07-20 NOTE — Patient Instructions (Addendum)
Thank you for coming to the clinic today.  1. Keep up the great work overall. - Agree with plan for weight loss, increasing or returning to gym - Let me know if need any other help with wt, can get additional nutrition help or referral to weight management  2. For BP - still mildly elevated, concern about future risk of new dx HTN - Check BP at home or pharmacy maybe 1x weekly or more, write down numbers, if persistently >138 / 88, then notify office after several weeks of not improving, then we can re consider BP medications - Check food labels for sodium content, try to lower  No other screening tests yet, next age 46.  Flu shot in Fall, get us record.  Please schedule a Follow-up Appointment to: Return in about 6 months (around 01/20/2018) for Elevated BP, Weight, Nocturia/Prostate.  If you have any other questions or concerns, please feel free to call the clinic or send a message through MyChart. You may also schedule an earlier appointment if necessary.  Additionally, you may be receiving a survey about your experience at our clinic within a few days to 1 week by e-mail or mail. We value your feedback.  Saralyn PilarAlexander Glady Ouderkirk, DO [facility]

## 2017-07-20 NOTE — Progress Notes (Signed)
Subjective:    Patient ID: Maurice Rogers, male    DOB: 1971-05-01, 46 y.o.   MRN: [id number]  Maurice Rogers is a 46 y.o. male presenting on 07/20/2017 for Annual Exam   HPI   Here for Annual Physical and Lab Review.  ELEVATED BP without dx HTN Reports only history was elevated BP in high school, met a new doctor based on one elevated reading was started on anti-HTN medication at young age, within 1 week on re-check BP dramatically low, he was stopped on this medicine and has never been back on anti-HTN meds since, and never as an adult. - He does admit to occasional mild elevated readings in doctors office but never diagnosed with HTN Current Meds - None Lifestyle: - Diet: No particular diet now, see below. Not watching sodium intake on food labels (eats out 50% of time), not adding salt - Exercise: Less regular exercise, see below on weight - Other factors: drinks coffee 1x every other morning, knee pain is improved, now inc stress with custody battle with ex-wife,  Denies CP, dyspnea, HA, edema, dizziness / lightheadedness  Abnormal Weight Gain / Morbid Obesity BMI >42 / HYPERLIPIDEMIA - Reports concern of gradual weight gain >1 year, approx +25 lbs, he attributes to less physical activity and more sedentary lifestyle following prior knee problem. Now R knee pain is much improved after followed by ortho and injection/NSAID treatment. Lifestyle: - Diet: general diet, not following specialized plan, in past unsuccessful with low carb/low fat high protein diets, not consuming sugary drinks, mostly drinks water 20 oz bottle 4-6 times daily - Exercise: Plans to resume going back to gym regularly 3-5x weekly, in past this helped him lose weight - Regarding labs, last lipid 07/2017 had mild elevated TG and low HDL, overall improved. Never on Statin therapy. Not on ASA - History of elevated fasting glucose but normal A1c 5.1  History of low MCV - Prior labs with low MCV and problems with  donating blood plasma. Now repeat lab CBC with normal Hgb and normalized MCV. Resolved.  Health Maintenance: Cancer Screening: Age 6 and not indicated for routine colon or prostate CA screening, since no known family history of early/or any prostate or colon cancer. Reviewed potential changing of guidelines colon CA screening by ACS age >33, not interested at this time. No symptoms of colon cancer. And does admit to nocturia 2-3x nightly only otherwise no other BPH or urinary symptoms.   No past medical history on file. Past Surgical History:  Procedure Laterality Date  . FOOT SURGERY Right 1997  . HERNIA REPAIR Bilateral 1992  . NASAL SINUS SURGERY     Social History   Social History  . Marital status: Divorced    Spouse name: N/A  . Number of children: N/A  . Years of education: N/A   Occupational History  . Not on file.   Social History Main Topics  . Smoking status: Former Smoker    Quit date: 12/08/2000  . Smokeless tobacco: Never Used  . Alcohol use No  . Drug use: No  . Sexual activity: Not Currently   Other Topics Concern  . Not on file   Social History Narrative  . No narrative on file   Family History  Problem Relation Age of Onset  . Diabetes Mother   . Mental illness Mother        bipolar, depression  . Seizures Mother   . Stroke Mother   . Diabetes Sister   .  Diabetes Maternal Grandfather   . CAD Paternal Grandfather   . Hyperlipidemia Paternal Grandfather   . Hypertension Paternal Grandfather   . Heart disease Paternal Grandfather   . Cancer Paternal Grandfather   . Heart disease Father   . Dementia Maternal Grandmother    Current Outpatient Prescriptions on File Prior to Visit  Medication Sig  . naproxen (NAPROSYN) 500 MG tablet naproxen 500 mg tablet   No current facility-administered medications on file prior to visit.     Review of Systems  Constitutional: Negative for activity change, appetite change, chills, diaphoresis, fatigue and  fever.  HENT: Negative for congestion, hearing loss and sinus pressure.   Eyes: Negative for visual disturbance.  Respiratory: Negative for apnea, cough, choking, chest tightness, shortness of breath and wheezing.   Cardiovascular: Negative for chest pain, palpitations and leg swelling.  Gastrointestinal: Negative for abdominal pain, anal bleeding, blood in stool, constipation, diarrhea, nausea and vomiting.  Endocrine: Negative for cold intolerance and polyuria.  Genitourinary: Negative for decreased urine volume, difficulty urinating, dysuria, frequency, hematuria, scrotal swelling, testicular pain and urgency.       Nocturia  Musculoskeletal: Positive for arthralgias (Knees, improved now). Negative for back pain and neck pain.  Skin: Negative for rash.  Allergic/Immunologic: Negative for environmental allergies.  Neurological: Negative for dizziness, weakness, light-headedness, numbness and headaches.  Hematological: Negative for adenopathy.  Psychiatric/Behavioral: Negative for behavioral problems, dysphoric mood and sleep disturbance.   Per HPI unless specifically indicated above     Objective:    BP 138/84 (BP Location: Right Arm, Cuff Size: Large)   Pulse (!) 58   Temp 98.5 F (36.9 C) (Oral)   Resp 16   Ht 5\' 11"  (1.803 m)   Wt (!) 302 lb (137 kg)   BMI 42.12 kg/m   Wt Readings from Last 3 Encounters:  07/20/17 (!) 302 lb (137 kg)  05/22/17 296 lb (134.3 kg)  12/04/16 284 lb (128.8 kg)    Physical Exam  Constitutional: He is oriented to person, place, and time. He appears well-developed and well-nourished. No distress.  Well-appearing, comfortable, cooperative  HENT:  Head: Normocephalic and atraumatic.  Mouth/Throat: Oropharynx is clear and moist.  Eyes: Pupils are equal, round, and reactive to light. Conjunctivae and EOM are normal. Right eye exhibits no discharge. Left eye exhibits no discharge.  Neck: Normal range of motion. Neck supple. No thyromegaly present.    Cardiovascular: Normal rate, regular rhythm, normal heart sounds and intact distal pulses.   No murmur heard. Pulmonary/Chest: Effort normal and breath sounds normal. No respiratory distress. He has no wheezes. He has no rales.  Abdominal: Soft. Bowel sounds are normal. He exhibits no distension and no mass. There is no tenderness.  Musculoskeletal: Normal range of motion. He exhibits no edema or tenderness.  Upper / Lower Extremities: - Normal muscle tone, strength bilateral upper extremities 5/5, lower extremities 5/5  Lymphadenopathy:    He has no cervical adenopathy.  Neurological: He is alert and oriented to person, place, and time.  Distal sensation intact to light touch all extremities  Skin: Skin is warm and dry. No rash noted. He is not diaphoretic. No erythema.  Psychiatric: He has a normal mood and affect. His behavior is normal.  Well groomed, good eye contact, normal speech and thoughts  Nursing note and vitals reviewed.  Results for orders placed or performed in visit on 07/13/17  COMPLETE METABOLIC PANEL WITH GFR  Result Value Ref Range   Sodium 137 135 -  146 mmol/L   Potassium 4.2 3.5 - 5.3 mmol/L   Chloride 104 98 - 110 mmol/L   CO2 26 20 - 32 mmol/L   Glucose, Bld 113 (H) 65 - 99 mg/dL   BUN 8 7 - 25 mg/dL   Creat 0.99 5.85 - 9.38 mg/dL   Total Bilirubin 0.7 0.2 - 1.2 mg/dL   Alkaline Phosphatase 61 40 - 115 U/L   AST 19 10 - 40 U/L   ALT 20 9 - 46 U/L   Total Protein 6.3 6.1 - 8.1 g/dL   Albumin 4.0 3.6 - 5.1 g/dL   Calcium 8.8 8.6 - 41.1 mg/dL   GFR, Est African American >89 >=60 mL/min   GFR, Est Non African American >89 >=60 mL/min  CBC with Differential/Platelet  Result Value Ref Range   WBC 5.4 3.8 - 10.8 K/uL   RBC 5.35 4.20 - 5.80 MIL/uL   Hemoglobin 14.9 13.2 - 17.1 g/dL   HCT 52.7 66.4 - 15.4 %   MCV 83.4 80.0 - 100.0 fL   MCH 27.9 27.0 - 33.0 pg   MCHC 33.4 32.0 - 36.0 g/dL   RDW 92.4 (H) 43.1 - 11.2 %   Platelets 147 140 - 400 K/uL   MPV  10.0 7.5 - 12.5 fL   Neutro Abs 3,402 1,500 - 7,800 cells/uL   Lymphs Abs 1,566 850 - 3,900 cells/uL   Monocytes Absolute 270 200 - 950 cells/uL   Eosinophils Absolute 162 15 - 500 cells/uL   Basophils Absolute 0 0 - 200 cells/uL   Neutrophils Relative % 63 %   Lymphocytes Relative 29 %   Monocytes Relative 5 %   Eosinophils Relative 3 %   Basophils Relative 0 %   Smear Review Criteria for review not met   Lipid panel  Result Value Ref Range   Cholesterol 151 <200 mg/dL   Triglycerides 374 (H) <150 mg/dL   HDL 34 (L) >48 mg/dL   Total CHOL/HDL Ratio 4.4 <5.0 Ratio   VLDL 33 (H) <30 mg/dL   LDL Cholesterol 84 <416 mg/dL  Hemoglobin L0X  Result Value Ref Range   Hgb A1c MFr Bld 5.1 <5.7 %   Mean Plasma Glucose 100 mg/dL      Assessment & Plan:   Problem List Items Addressed This Visit    Morbid obesity with BMI of 40.0-44.9, adult (HCC)    Gradual wt gain over >1 year, +25 lbs, sedentary lifestyle with knee pain and no longer regular gym exercise, no particular improved diet - Risk factors with HLD, elevated BP, family history DM/CVD  Plan: 1. Encouraged improvement lifestyle - diet, exercise - return to gym. Patient is motivated, will help control BP and glucose and HLD 2. Declined referral to nutritionist, wt management specialist 3. Follow-up wt check q 6 months      Hyperlipidemia    Mostly acceptable cholesterol without med and some poor lifestyle Last lipid panel 07/2017 Calculated ASCVD 10 yr risk score 3.1% (avg 1.7)  Plan: 1. Counseling on ASCVD risk - agree to hold rec statin at this time, also considered ASA 81mg  daily for risk reduction, since age < 58 agree to defer for now 2. Encourage improved lifestyle - low carb/cholesterol, reduce portion size, restat regular exercise 3. Follow-up q 6 mo and lipids q 1 yr      Elevated BP without diagnosis of hypertension    Mildly elevated initial BP, repeat manual check improved, still borderline SBP 138. - Home BP  readings none  No known complications   Plan:  1. Never on treatment - agree to hold for now, discuss concern may progress to dx HTN. Need more data. 2. Encourage improved lifestyle - low sodium diet, regular exercise 3. Start monitor BP outside office, bring readings to next visit, if persistently >138/88 or new symptoms notify office sooner 4. Follow-up q 6 months       Other Visit Diagnoses    Annual physical exam    -  Primary      No orders of the defined types were placed in this encounter.   Follow up plan: Return in about 6 months (around 01/20/2018) for Elevated BP, Weight, Nocturia/Prostate.  Nobie Putnam, [facility] 07/20/2017, 10:13 PM

## 2017-07-20 NOTE — Assessment & Plan Note (Addendum)
Gradual wt gain over >1 year, +25 lbs, sedentary lifestyle with knee pain and no longer regular gym exercise, no particular improved diet - Risk factors with HLD, elevated BP, family history DM/CVD  Plan: 1. Encouraged improvement lifestyle - diet, exercise - return to gym. Patient is motivated, will help control BP and glucose and HLD 2. Declined referral to nutritionist, wt management specialist 3. Follow-up wt check q 6 months

## 2017-07-20 NOTE — Assessment & Plan Note (Signed)
Mostly acceptable cholesterol without med and some poor lifestyle Last lipid panel 07/2017 Calculated ASCVD 10 yr risk score 3.1% (avg 1.7)  Plan: 1. Counseling on ASCVD risk - agree to hold rec statin at this time, also considered ASA 81mg  daily for risk reduction, since age < 1850 agree to defer for now 2. Encourage improved lifestyle - low carb/cholesterol, reduce portion size, restat regular exercise 3. Follow-up q 6 mo and lipids q 1 yr

## 2017-11-18 ENCOUNTER — Encounter: Payer: Self-pay | Admitting: Family Medicine

## 2017-11-18 ENCOUNTER — Ambulatory Visit: Payer: BC Managed Care – PPO | Admitting: Family Medicine

## 2017-11-18 VITALS — BP 144/88 | HR 73 | Temp 98.5°F | Resp 16 | Ht 71.0 in | Wt 305.0 lb

## 2017-11-18 DIAGNOSIS — G5601 Carpal tunnel syndrome, right upper limb: Secondary | ICD-10-CM | POA: Diagnosis not present

## 2017-11-18 MED ORDER — PREDNISONE 10 MG PO TABS: ORAL_TABLET | ORAL | 0 refills | Status: DC

## 2017-11-18 NOTE — Patient Instructions (Addendum)
Thank you for coming to the clinic today.  1.  Most consistent with carpal tunnel syndrome on Right hand/wrist based on your symptoms  Start with Prednisone taper 6 days from 60 down to 10mg  then stop - While on Prednisone - HOLD Meloxicam for 6 days until finished then may resume  Recommend to start taking Tylenol Extra Strength 500mg  tabs - take 1 to 2 tabs per dose (max 1000mg ) every 6-8 hours for pain (take regularly, don't skip a dose for next 7 days), max 24 hour daily dose is 6 tablets or 3000mg . In the future you can repeat the same everyday Tylenol course for 1-2 weeks at a time.   2. Referral sent to Neurologist - for Nerve Conduction Study and also consultation  [facility] [address] [place], [place] [address] Phone: [phone]  Consider steroid injection, other therapy and possibly referral to Ortho for possible carpal tunnel release surgery  Please schedule a Follow-up Appointment to: Return if symptoms worsen or fail to improve, for R carpal tunnel.  If you have any other questions or concerns, please feel free to call the clinic or send a message through MyChart. You may also schedule an earlier appointment if necessary.  Additionally, you may be receiving a survey about your experience at our clinic within a few days to 1 week by e-mail or mail. We value your feedback.  Saralyn PilarAlexander Tymeer Vaquera, DO [facility], [facility]

## 2017-11-18 NOTE — Progress Notes (Signed)
Subjective:    Patient ID: Maurice Rogers, male    DOB: Nov 19, 1971, 46 y.o.   MRN: [id number]  OTHER ATIENZA is a 46 y.o. male presenting on 11/18/2017 for Carpal Tunnel (right side onset 2 weeks )  Patient presents for a same day appointment.  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 Over years he had gradual problem, seemed episodic or more minor in past, never seen by Orthopedics or specialist, never had nerve testing, never on rx treatment, or never had any injury or surgery. - Now today reports significant change over past 2 weeks with dramatic worsening with more persistent symptoms, over that time, without any provoking injury or repetitive activity, he has tried wearing a splint at night only without relief, he describes symptoms without pain but more of a numbness tingling, has some reduced touch in fingers - Taking Meloxicam '15mg'$  daily for knees and arthritis other joints  Additional updates: - He has been monitor BP outside office several readings avg 140/80-90, he is concerned about possible new dx HTN, will return in few weeks to review all readings and discuss. - Knee pain is stable to improved   Depression screen [facility] 2/9 07/20/2017 07/17/2016  Decreased Interest 0 0  Down, Depressed, Hopeless 0 0  PHQ - 2 Score 0 0    Social History   Tobacco Use  . Smoking status: Former Smoker    Last attempt to quit: 12/08/2000    Years since quitting: 16.9  . Smokeless tobacco: Never Used  Substance Use Topics  . Alcohol use: No  . Drug use: No    Review of Systems Per HPI unless specifically indicated above     Objective:    BP (!) 144/88   Pulse 73   Temp 98.5 F (36.9 C) (Oral)   Resp 16   Ht  '5\' 11"'$  (1.803 m)   Wt (!) 305 lb (138.3 kg)   BMI 42.54 kg/m   Wt Readings from Last 3 Encounters:  11/18/17 (!) 305 lb (138.3 kg)  07/20/17 (!) 302 lb (137 kg)  05/22/17 296 lb (134.3 kg)    Physical Exam  Constitutional: He is oriented to person, place, and time. He appears well-developed and well-nourished. No distress.  Well-appearing, comfortable, cooperative  HENT:  Head: Normocephalic and atraumatic.  Mouth/Throat: Oropharynx is clear and moist.  Eyes: Conjunctivae are normal. Right eye exhibits no discharge. Left eye exhibits no discharge.  Neck: Normal range of motion. Neck supple.  Cardiovascular: Normal rate.  Pulmonary/Chest: Effort normal.  Musculoskeletal: Normal range of motion. He exhibits no edema.  Right Hand/Wrist Inspection: Normal appearance, symmetrical, no bulky MCP joints. Very minimal soft tissue edema of 1st and 2nd finger. No erythema. Palpation: Non tender to palpation hand / wrist, carpal bones, including MCP, base of thumb. No distinct anatomical snuff box or scaphoid tenderness. ROM: full active wrist ROM flex / ext, ulnar / radial deviation, no pain with radial deviation Special Testing: Negative Finkelstein's test. Negative  Tinel's median nerve. Negative Phalen's and Reverse Phalen's to provoke symptoms Strength: 5/5 grip, thumb opposition, wrist flex/ext Neurovascular: mildly reduced distal sensation over thumb and index/middle finger  Neurological: He is alert and oriented to person, place,  and time. No cranial nerve deficit.  No other extremity reduced sensation Right arm and shoulder intact sensation  Skin: Skin is warm and dry. No rash noted. He is not diaphoretic. No erythema.  Psychiatric: He has a normal mood and affect. His behavior is normal.  Well groomed, good eye contact, normal speech and thoughts  Nursing note and vitals reviewed.  Results for orders placed or performed in visit on 07/13/17  COMPLETE METABOLIC PANEL WITH GFR    Result Value Ref Range   Sodium 137 135 - 146 mmol/L   Potassium 4.2 3.5 - 5.3 mmol/L   Chloride 104 98 - 110 mmol/L   CO2 26 20 - 32 mmol/L   Glucose, Bld 113 (H) 65 - 99 mg/dL   BUN 8 7 - 25 mg/dL   Creat 0.76 0.60 - 1.35 mg/dL   Total Bilirubin 0.7 0.2 - 1.2 mg/dL   Alkaline Phosphatase 61 40 - 115 U/L   AST 19 10 - 40 U/L   ALT 20 9 - 46 U/L   Total Protein 6.3 6.1 - 8.1 g/dL   Albumin 4.0 3.6 - 5.1 g/dL   Calcium 8.8 8.6 - 10.3 mg/dL   GFR, Est African American >89 >=60 mL/min   GFR, Est Non African American >89 >=60 mL/min  CBC with Differential/Platelet  Result Value Ref Range   WBC 5.4 3.8 - 10.8 K/uL   RBC 5.35 4.20 - 5.80 MIL/uL   Hemoglobin 14.9 13.2 - 17.1 g/dL   HCT 44.6 38.5 - 50.0 %   MCV 83.4 80.0 - 100.0 fL   MCH 27.9 27.0 - 33.0 pg   MCHC 33.4 32.0 - 36.0 g/dL   RDW 15.8 (H) 11.0 - 15.0 %   Platelets 147 140 - 400 K/uL   MPV 10.0 7.5 - 12.5 fL   Neutro Abs 3,402 1,500 - 7,800 cells/uL   Lymphs Abs 1,566 850 - 3,900 cells/uL   Monocytes Absolute 270 200 - 950 cells/uL   Eosinophils Absolute 162 15 - 500 cells/uL   Basophils Absolute 0 0 - 200 cells/uL   Neutrophils Relative % 63 %   Lymphocytes Relative 29 %   Monocytes Relative 5 %   Eosinophils Relative 3 %   Basophils Relative 0 %   Smear Review Criteria for review not met   Lipid panel  Result Value Ref Range   Cholesterol 151 <200 mg/dL   Triglycerides 167 (H) <150 mg/dL   HDL 34 (L) >40 mg/dL   Total CHOL/HDL Ratio 4.4 <5.0 Ratio   VLDL 33 (H) <30 mg/dL   LDL Cholesterol 84 <100 mg/dL  Hemoglobin A1c  Result Value Ref Range   Hgb A1c MFr Bld 5.1 <5.7 %   Mean Plasma Glucose 100 mg/dL      Assessment & Plan:   Problem List Items Addressed This Visit    Right carpal tunnel syndrome - Primary    Clinically most consistent with chronic gradual worsening R hand carpal tunnel syndrome, seems limited to hand, history and exam not suggestive of elbow or shoulder / neck impingement, no other  nerves involved or other complication - Likely with chronic repetitive overuse R handed - Limited improvement conservative therapy with NSAID meloxicam most recently, wrist splint nightly  Plan: 1. Discussed course of carpal tunnel syndrome, treatment, prognosis, complications 2. HOLD Meloxicam '15mg'$  daily for 6 days - START Prednisone taper '60mg'$  down to '10mg'$  down by 10 each day - if improve and need extend can add 3-5  more days if need, review precautions - May resume Meloxicam after finish prednisone - Take Tylenol PRN breakthrough 3. Continue wrist splint support at night, may use during day with excessive or repetitive activities 4. Referral to [facility]rology for NCS and evaluation consult, may need injection or other approach - possibly future referral to Ortho for surgical release if diagnostic given chronicity of problem >20 years      Relevant Medications   predniSONE (DELTASONE) 10 MG tablet   Other Relevant Orders   Ambulatory referral to Neurology      Meds ordered this encounter  Medications  . predniSONE (DELTASONE) 10 MG tablet    Sig: Take 6 tabs with breakfast Day 1, 5 tabs Day 2, 4 tabs Day 3, 3 tabs Day 4, 2 tabs Day 5, 1 tab Day 6.    Dispense:  21 tablet    Refill:  0   Orders Placed This Encounter  Procedures  . Ambulatory referral to Neurology    Referral Priority:   Routine    Referral Type:   Consultation    Referral Reason:   Specialty Services Required    Requested Specialty:   Neurology    Number of Visits Requested:   1    Follow up plan: Return if symptoms worsen or fail to improve, for R carpal tunnel.  Nobie Putnam, DO [facility] 11/18/2017, 5:50 PM

## 2017-11-18 NOTE — Assessment & Plan Note (Signed)
Clinically most consistent with chronic gradual worsening R hand carpal tunnel syndrome, seems limited to hand, history and exam not suggestive of elbow or shoulder / neck impingement, no other nerves involved or other complication - Likely with chronic repetitive overuse R handed - Limited improvement conservative therapy with NSAID meloxicam most recently, wrist splint nightly  Plan: 1. Discussed course of carpal tunnel syndrome, treatment, prognosis, complications 2. HOLD Meloxicam 15mg  daily for 6 days - START Prednisone taper 60mg  down to 10mg  down by 10 each day - if improve and need extend can add 3-5 more days if need, review precautions - May resume Meloxicam after finish prednisone - Take Tylenol PRN breakthrough 3. Continue wrist splint support at night, may use during day with excessive or repetitive activities 4. [facility] for NCS and evaluation consult, may need injection or other approach - possibly future referral to Ortho for surgical release if diagnostic given chronicity of problem >20 years

## 2017-12-11 ENCOUNTER — Ambulatory Visit: Payer: BC Managed Care – PPO | Admitting: Family Medicine

## 2018-02-11 ENCOUNTER — Other Ambulatory Visit: Payer: Self-pay

## 2018-02-11 ENCOUNTER — Encounter
Admission: RE | Admit: 2018-02-11 | Discharge: 2018-02-11 | Disposition: A | Discharge status: Home / Self Care | Payer: BC Managed Care – PPO | Source: Ambulatory Visit | Attending: Orthopedic Surgery | Admitting: Orthopedic Surgery

## 2018-02-11 NOTE — Patient Instructions (Signed)
Your procedure is scheduled on: 02-18-18 THURSDAY Report to Same Day Surgery 2nd floor medical mall [facility](Medical Mall Entrance-take elevator on left to 2nd floor.  Check in with surgery information desk.) To find out your arrival time please call [phone] between 1PM - 3PM [facility]  Remember: Instructions that are not followed completely may result in serious medical risk, up to and including death, or upon the discretion of your surgeon and anesthesiologist your surgery may need to be rescheduled.    _x___ 1. Do not eat food after midnight the night before your procedure. NO GUM OR CANDY AFTER MIDNIGHT.  You may drink clear liquids up to 2 hours before you are scheduled to arrive at the hospital for your procedure.  Do not drink clear liquids within 2 hours of your scheduled arrival to the hospital.  Clear liquids include  --Water or Apple juice without pulp  --Clear carbohydrate beverage such as ClearFast or Gatorade  --Black Coffee or Clear Tea (No milk, no creamers, do not add anything to  the coffee or Tea    __x__ 2. No Alcohol for 24 hours before or after surgery.   __x__3. No Smoking or e-cigarettes for 24 prior to surgery.  Do not use any chewable tobacco products for at least 6 hour prior to surgery   ____  4. Bring all medications with you on the day of surgery if instructed.    __x__ 5. Notify your doctor if there is any change in your medical condition     (cold, fever, infections).    x___6. On the morning of surgery brush your teeth with toothpaste and water.  You may rinse your mouth with mouth wash if you wish.  Do not swallow any toothpaste or mouthwash.   Do not wear jewelry, make-up, hairpins, clips or nail polish.  Do not wear lotions, powders, or perfumes. You may wear deodorant.  Do not shave 48 hours prior to surgery. Men may shave face and neck.  Do not bring valuables to the hospital.    [facility] is not responsible for any belongings or  valuables.               Contacts, dentures or bridgework may not be worn into surgery.  Leave your suitcase in the car. After surgery it may be brought to your room.  For patients admitted to the hospital, discharge time is determined by your treatment team.  _  Patients discharged the day of surgery will not be allowed to drive home.  You will need someone to drive you home and stay with you the night of your procedure.    Please read over the following fact sheets that you were given:   [facility] Preparing for [facility] and or MRSA Information   ____ Take anti-hypertensive listed below, cardiac, seizure, asthma, anti-reflux and psychiatric medicines. These include:  1. NONE  2.  3.  4.  5.  6.  ____Fleets enema or Magnesium Citrate as directed.   _x___ Use CHG Soap or sage wipes as directed on instruction sheet   ____ Use inhalers on the day of surgery and bring to hospital day of surgery  ____ Stop Metformin and Janumet 2 days prior to surgery.    ____ Take 1/2 of usual insulin dose the night before surgery and none on the morning surgery.   ____ Follow recommendations from Cardiologist, Pulmonologist or PCP regarding stopping Aspirin, Coumadin, Plavix ,Eliquis, Effient, or Pradaxa, and Pletal.  X____Stop  Anti-inflammatories such as Advil, Aleve, Ibuprofen, Motrin, Naproxen, MOBIC, Naprosyn, Goodies powders or aspirin products NOW-OK to take Tylenol    ____ Stop supplements until after surgery   ____ Bring C-Pap to the hospital.

## 2018-02-15 ENCOUNTER — Encounter
Admission: RE | Admit: 2018-02-15 | Discharge: 2018-02-15 | Disposition: A | Discharge status: Home / Self Care | Payer: BC Managed Care – PPO | Source: Ambulatory Visit | Attending: Orthopedic Surgery | Admitting: Orthopedic Surgery

## 2018-02-15 DIAGNOSIS — G5601 Carpal tunnel syndrome, right upper limb: Secondary | ICD-10-CM | POA: Diagnosis not present

## 2018-02-15 DIAGNOSIS — E669 Obesity, unspecified: Secondary | ICD-10-CM | POA: Diagnosis not present

## 2018-02-15 DIAGNOSIS — Z87891 Personal history of nicotine dependence: Secondary | ICD-10-CM | POA: Diagnosis not present

## 2018-02-15 DIAGNOSIS — Z6841 Body Mass Index (BMI) 40.0 and over, adult: Secondary | ICD-10-CM | POA: Diagnosis not present

## 2018-02-15 DIAGNOSIS — Z791 Long term (current) use of non-steroidal anti-inflammatories (NSAID): Secondary | ICD-10-CM | POA: Diagnosis not present

## 2018-02-15 LAB — BASIC METABOLIC PANEL
Anion gap: 10 (ref 5–15)
BUN: 10 mg/dL (ref 6–20)
CALCIUM: 8.8 mg/dL — AB (ref 8.9–10.3)
CHLORIDE: 103 mmol/L (ref 101–111)
CO2: 23 mmol/L (ref 22–32)
CREATININE: 0.87 mg/dL (ref 0.61–1.24)
GFR calc Af Amer: >60 mL/min (ref >60)
Glucose, Bld: 159 mg/dL — ABNORMAL HIGH (ref 65–99)
Potassium: 3.8 mmol/L (ref 3.5–5.1)
SODIUM: 136 mmol/L (ref 135–145)

## 2018-02-17 ENCOUNTER — Encounter: Payer: Self-pay | Admitting: *Deleted

## 2018-02-17 NOTE — Pre-Procedure Instructions (Signed)
Pt came in to PAT for labs on 02-17-18 and told me that he was [place] an [place] after [place]- I told him that he had to have someone with him to sign him out of the hospital and someone to [place] in [place].  Pt unsure if he can find someone and I told him he needs to call Dr Neomia GlassMenz's office if he sees he cant find someone. Pt verbalized he would.

## 2018-02-18 ENCOUNTER — Ambulatory Visit: Payer: BC Managed Care – PPO | Admitting: Anesthesiology

## 2018-02-18 ENCOUNTER — Encounter
Admission: RE | Disposition: A | Discharge status: Home / Self Care | Payer: Self-pay | Source: Ambulatory Visit | Attending: Orthopedic Surgery

## 2018-02-18 ENCOUNTER — Ambulatory Visit
Admission: RE | Admit: 2018-02-18 | Discharge: 2018-02-18 | Disposition: A | Discharge status: Home / Self Care | Payer: BC Managed Care – PPO | Source: Ambulatory Visit | Attending: Orthopedic Surgery | Admitting: Orthopedic Surgery

## 2018-02-18 ENCOUNTER — Other Ambulatory Visit: Payer: Self-pay

## 2018-02-18 ENCOUNTER — Encounter: Payer: Self-pay | Admitting: *Deleted

## 2018-02-18 DIAGNOSIS — G5601 Carpal tunnel syndrome, right upper limb: Secondary | ICD-10-CM | POA: Insufficient documentation

## 2018-02-18 DIAGNOSIS — E669 Obesity, unspecified: Secondary | ICD-10-CM | POA: Insufficient documentation

## 2018-02-18 DIAGNOSIS — Z87891 Personal history of nicotine dependence: Secondary | ICD-10-CM | POA: Insufficient documentation

## 2018-02-18 DIAGNOSIS — Z6841 Body Mass Index (BMI) 40.0 and over, adult: Secondary | ICD-10-CM | POA: Insufficient documentation

## 2018-02-18 DIAGNOSIS — Z791 Long term (current) use of non-steroidal anti-inflammatories (NSAID): Secondary | ICD-10-CM | POA: Insufficient documentation

## 2018-02-18 HISTORY — DX: Obesity, unspecified: E66.9

## 2018-02-18 HISTORY — PX: CARPAL TUNNEL RELEASE: SHX101

## 2018-02-18 SURGERY — CARPAL TUNNEL RELEASE
Anesthesia: General | Laterality: Right

## 2018-02-18 MED ORDER — ONDANSETRON HCL 4 MG/2ML IJ SOLN: 4.0000 mg | Freq: Four times a day (QID) | INTRAMUSCULAR | Status: DC | PRN

## 2018-02-18 MED ORDER — PROPOFOL 10 MG/ML IV BOLUS
INTRAVENOUS | Status: DC | PRN
  Administered 2018-02-18: 300 mg via INTRAVENOUS

## 2018-02-18 MED ORDER — ONDANSETRON HCL 4 MG PO TABS: 4.0000 mg | ORAL_TABLET | Freq: Four times a day (QID) | ORAL | Status: DC | PRN

## 2018-02-18 MED ORDER — ONDANSETRON HCL 4 MG/2ML IJ SOLN
INTRAMUSCULAR | Status: DC | PRN
  Administered 2018-02-18: 4 mg via INTRAVENOUS

## 2018-02-18 MED ORDER — LIDOCAINE HCL (PF) 2 % IJ SOLN
INTRAMUSCULAR | Status: AC
  Filled 2018-02-18: qty 10

## 2018-02-18 MED ORDER — OXYCODONE HCL 5 MG PO TABS: 5.0000 mg | ORAL_TABLET | Freq: Once | ORAL | Status: DC | PRN

## 2018-02-18 MED ORDER — LACTATED RINGERS IV SOLN
INTRAVENOUS | Status: DC
  Administered 2018-02-18: 13:00:00 via INTRAVENOUS

## 2018-02-18 MED ORDER — METOCLOPRAMIDE HCL 10 MG PO TABS: 5.0000 mg | ORAL_TABLET | Freq: Three times a day (TID) | ORAL | Status: DC | PRN

## 2018-02-18 MED ORDER — PROMETHAZINE HCL 25 MG/ML IJ SOLN: 6.2500 mg | INTRAMUSCULAR | Status: DC | PRN

## 2018-02-18 MED ORDER — FENTANYL CITRATE (PF) 100 MCG/2ML IJ SOLN
INTRAMUSCULAR | Status: DC | PRN
  Administered 2018-02-18: 50 ug via INTRAVENOUS

## 2018-02-18 MED ORDER — FENTANYL CITRATE (PF) 100 MCG/2ML IJ SOLN: 25.0000 ug | INTRAMUSCULAR | Status: DC | PRN

## 2018-02-18 MED ORDER — OXYCODONE HCL 5 MG/5ML PO SOLN: 5.0000 mg | Freq: Once | ORAL | Status: DC | PRN

## 2018-02-18 MED ORDER — FAMOTIDINE 20 MG PO TABS
20.0000 mg | ORAL_TABLET | Freq: Once | ORAL | Status: AC
  Administered 2018-02-18: 20 mg via ORAL

## 2018-02-18 MED ORDER — HYDROCODONE-ACETAMINOPHEN 5-325 MG PO TABS: 1.0000 | ORAL_TABLET | ORAL | Status: DC | PRN

## 2018-02-18 MED ORDER — DEXAMETHASONE SODIUM PHOSPHATE 10 MG/ML IJ SOLN
INTRAMUSCULAR | Status: DC | PRN
  Administered 2018-02-18: 10 mg via INTRAVENOUS

## 2018-02-18 MED ORDER — BUPIVACAINE HCL 0.5 % IJ SOLN
INTRAMUSCULAR | Status: DC | PRN
  Administered 2018-02-18: 10 mL

## 2018-02-18 MED ORDER — SODIUM CHLORIDE 0.9 % IV SOLN: INTRAVENOUS | Status: DC

## 2018-02-18 MED ORDER — PROPOFOL 10 MG/ML IV BOLUS
INTRAVENOUS | Status: AC
  Filled 2018-02-18: qty 20

## 2018-02-18 MED ORDER — MEPERIDINE HCL 50 MG/ML IJ SOLN: 6.2500 mg | INTRAMUSCULAR | Status: DC | PRN

## 2018-02-18 MED ORDER — LACTATED RINGERS IV SOLN
INTRAVENOUS | Status: DC | PRN
  Administered 2018-02-18: 14:00:00 via INTRAVENOUS

## 2018-02-18 MED ORDER — LIDOCAINE HCL (CARDIAC) 20 MG/ML IV SOLN
INTRAVENOUS | Status: DC | PRN
  Administered 2018-02-18: 100 mg via INTRAVENOUS

## 2018-02-18 MED ORDER — BUPIVACAINE HCL (PF) 0.5 % IJ SOLN
INTRAMUSCULAR | Status: AC
  Filled 2018-02-18: qty 30

## 2018-02-18 MED ORDER — MIDAZOLAM HCL 2 MG/2ML IJ SOLN
INTRAMUSCULAR | Status: DC | PRN
  Administered 2018-02-18: 2 mg via INTRAVENOUS

## 2018-02-18 MED ORDER — METOCLOPRAMIDE HCL 5 MG/ML IJ SOLN: 5.0000 mg | Freq: Three times a day (TID) | INTRAMUSCULAR | Status: DC | PRN

## 2018-02-18 MED ORDER — HYDROCODONE-ACETAMINOPHEN 5-325 MG PO TABS: 1.0000 | ORAL_TABLET | Freq: Four times a day (QID) | ORAL | 0 refills | Status: DC | PRN

## 2018-02-18 MED ORDER — EPHEDRINE SULFATE 50 MG/ML IJ SOLN
INTRAMUSCULAR | Status: DC | PRN
  Administered 2018-02-18 (×2): 10 mg via INTRAVENOUS

## 2018-02-18 SURGICAL SUPPLY — 23 items
BANDAGE ACE 3X5.8 VEL STRL LF (GAUZE/BANDAGES/DRESSINGS) ×3 IMPLANT
CANISTER SUCT 1200ML W/VALVE (MISCELLANEOUS) ×3 IMPLANT
CHLORAPREP W/TINT 26ML (MISCELLANEOUS) ×3 IMPLANT
CUFF TOURN 18 STER (MISCELLANEOUS) IMPLANT
CUFF TOURN 24 STER (MISCELLANEOUS) ×3 IMPLANT
ELECT CAUTERY NEEDLE 2.0 MIC (NEEDLE) IMPLANT
GAUZE PETRO XEROFOAM 1X8 (MISCELLANEOUS) ×3 IMPLANT
GAUZE SPONGE 4X4 12PLY STRL (GAUZE/BANDAGES/DRESSINGS) ×3 IMPLANT
GLOVE SURG SYN 9.0  PF PI (GLOVE) ×2
GLOVE SURG SYN 9.0 PF PI (GLOVE) ×1 IMPLANT
GOWN SRG 2XL LVL 4 RGLN SLV (GOWNS) ×1 IMPLANT
GOWN STRL NON-REIN 2XL LVL4 (GOWNS) ×2
GOWN STRL REUS W/ TWL LRG LVL3 (GOWN DISPOSABLE) ×1 IMPLANT
GOWN STRL REUS W/TWL LRG LVL3 (GOWN DISPOSABLE) ×2
KIT TURNOVER KIT A (KITS) ×3 IMPLANT
NS IRRIG 500ML POUR BTL (IV SOLUTION) ×3 IMPLANT
PACK EXTREMITY ARMC (MISCELLANEOUS) ×3 IMPLANT
PAD CAST CTTN 4X4 STRL (SOFTGOODS) ×1 IMPLANT
PADDING CAST COTTON 4X4 STRL (SOFTGOODS) ×2
SCALPEL PROTECTED #15 DISP (BLADE) ×6 IMPLANT
SUT ETHILON 4-0 (SUTURE) ×2
SUT ETHILON 4-0 FS2 18XMFL BLK (SUTURE) ×1
SUTURE ETHLN 4-0 FS2 18XMF BLK (SUTURE) ×1 IMPLANT

## 2018-02-18 NOTE — Anesthesia Preprocedure Evaluation (Signed)
Anesthesia Evaluation  Patient identified by MRN, date of birth, ID band Patient awake    Reviewed: Allergy & Precautions, NPO status , Patient's Chart, lab work & pertinent test results  History of Anesthesia Complications Negative for: history of anesthetic complications  Airway Mallampati: III  TM Distance: >3 FB Neck ROM: Full    Dental no notable dental hx.    Pulmonary neg sleep apnea, neg COPD, former smoker,    breath sounds clear to auscultation- rhonchi (-) wheezing      Cardiovascular Exercise Tolerance: Good (-) hypertension(-) CAD and (-) Past MI  Rhythm:Regular Rate:Normal - Systolic murmurs and - Diastolic murmurs    Neuro/Psych negative neurological ROS  negative psych ROS   GI/Hepatic negative GI ROS, Neg liver ROS,   Endo/Other  negative endocrine ROSneg diabetes  Renal/GU negative Renal ROS     Musculoskeletal  (+) Arthritis ,   Abdominal (+) + obese,   Peds  Hematology negative hematology ROS (+)   Anesthesia Other Findings Past Medical History: No date: Obesity   Reproductive/Obstetrics                             Anesthesia Physical Anesthesia Plan  ASA: II  Anesthesia Plan: General   Post-op Pain Management:    Induction: Intravenous  PONV Risk Score and Plan: 1 and Ondansetron and Midazolam  Airway Management Planned: LMA  Additional Equipment:   Intra-op Plan:   Post-operative Plan:   Informed Consent: I have reviewed the patients History and Physical, chart, labs and discussed the procedure including the risks, benefits and alternatives for the proposed anesthesia with the patient or authorized representative who has indicated his/her understanding and acceptance.   Dental advisory given  Plan Discussed with: CRNA and Anesthesiologist  Anesthesia Plan Comments:         Anesthesia Quick Evaluation

## 2018-02-18 NOTE — Anesthesia Post-op Follow-up Note (Signed)
Anesthesia QCDR form completed.        

## 2018-02-18 NOTE — Transfer of Care (Signed)
Immediate Anesthesia Transfer of Care Note  Patient: Maurice Rogers  Procedure(s) Performed: CARPAL TUNNEL RELEASE (Right )  Patient Location: PACU  Anesthesia Type:General  Level of Consciousness: sedated  Airway & Oxygen Therapy: Patient Spontanous Breathing  Post-op Assessment: Report given to RN  Post vital signs: stable  Last Vitals:  Vitals:   02/18/18 1312 02/18/18 1443  BP: 130/88 125/82  Pulse: 69 68  Resp: 18 14  Temp: (!) 35.6 C (!) 36.1 C  SpO2: 100% 95%    Last Pain:  Vitals:   02/18/18 1312  TempSrc: Oral  PainSc: 3          Complications: No apparent anesthesia complications

## 2018-02-18 NOTE — Op Note (Signed)
02/18/2018  2:44 PM  PATIENT:  Maurice Rogers  47 y.o. male  PRE-OPERATIVE DIAGNOSIS:  carpal tunnel syndrome right wrist  POST-OPERATIVE DIAGNOSIS:  carpal tunnel syndrome right wrist  PROCEDURE:  Procedure(s): CARPAL TUNNEL RELEASE (Right)  SURGEON: Leitha SchullerMichael J Allianna Beaubien, MD  ASSISTANTS: None  ANESTHESIA:   general  EBL:  Total I/O In: 300 [I.V.:300] Out: 1 [Blood:1]  BLOOD ADMINISTERED:none  DRAINS: none   LOCAL MEDICATIONS USED:  MARCAINE     SPECIMEN:  No Specimen  DISPOSITION OF SPECIMEN:  N/A  COUNTS:  YES  TOURNIQUET:   Total Tourniquet Time Documented: Upper Arm (Right) - 10 minutes Total: Upper Arm (Right) - 10 minutes   IMPLANTS: None  DICTATION: .Dragon Dictation patient brought the operating room and after adequate anesthesia was obtained, the right arm was prepped and draped in sterile fashion.  After patient identification timeout procedures were completed tourniquet was raised to 1250 mmHg a 2 cm incision was made in line with the ring metacarpal.  The skin and subcutaneous tissue was spread and a retractor placed where the transverse carpal ligament could be identified.  There was some hypertrophic muscle from the thenar mass that was elevated and the transcarpal ligament opened.  A vascular hemostat was placed deep to this to protect the underlying structures and release carried out distally and then coming proximally proximal to the wrist flexion crease there was blanching of the nerve and about a 1 cm area in the midportion of the transcarpal ligament that had the compression after release of this there is good vascular blush to the nerve.  Local anesthetic was infiltrated into the area around the incision a total of 10 cc half percent Sensorcaine the wound was then irrigated and then closed with some interrupted 4-0 nylon skin sutures out by Xeroform 4 x 4 web roll and Ace wrap  PLAN OF CARE: Discharge to home after PACU  PATIENT DISPOSITION:  PACU -  hemodynamically stable.

## 2018-02-18 NOTE — H&P (Signed)
Reviewed paper H+P, will be scanned into chart. No changes noted.  

## 2018-02-18 NOTE — Anesthesia Postprocedure Evaluation (Signed)
Anesthesia Post Note  Patient: Hunt C Buswell  Procedure(s) Performed: CARPAL TUNNEL RELEASE (Right )  Patient location during evaluation: PACU Anesthesia Type: General Level of consciousness: awake and alert and oriented Pain management: pain level controlled Vital Signs Assessment: post-procedure vital signs reviewed and stable Respiratory status: spontaneous breathing, nonlabored ventilation and respiratory function stable Cardiovascular status: blood pressure returned to baseline and stable Postop Assessment: no signs of nausea or vomiting Anesthetic complications: no     Last Vitals:  Vitals:   02/18/18 1514 02/18/18 1520  BP: 114/84   Pulse: 65 69  Resp: 17 16  Temp:    SpO2: 96% 98%    Last Pain:  Vitals:   02/18/18 1520  TempSrc:   PainSc: 0-No pain                 Naraya Stoneberg

## 2018-02-18 NOTE — Discharge Instructions (Addendum)
AMBULATORY SURGERY  DISCHARGE INSTRUCTIONS   1) The drugs that you were given will stay in your system until tomorrow so for the next 24 hours you should not:  A) Drive an automobile B) Make any legal decisions C) Drink any alcoholic beverage   2) You may resume regular meals tomorrow.  Today it is better to start with liquids and gradually work up to solid foods.  You may eat anything you prefer, but it is better to start with liquids, then soup and crackers, and gradually work up to solid foods.   3) Please notify your doctor immediately if you have any unusual bleeding, trouble breathing, redness and pain at the surgery site, drainage, fever, or pain not relieved by medication. 4)   5) Your post-operative visit with Dr.                                     is: Date:                        Time:    Please call to schedule your post-operative visit.  6) Additional Instructions:     Loosen Ace wrap prior to discharge.  Loosen Ace wrap over the weekend if fingers swell.  Work on finger range of motion is much as possible.  Pain medicine as directed.

## 2018-02-19 ENCOUNTER — Encounter: Payer: Self-pay | Admitting: Orthopedic Surgery

## 2018-12-03 ENCOUNTER — Encounter: Payer: BC Managed Care – PPO | Admitting: Family Medicine

## 2018-12-06 ENCOUNTER — Ambulatory Visit (INDEPENDENT_AMBULATORY_CARE_PROVIDER_SITE_OTHER): Payer: BC Managed Care – PPO | Admitting: Family Medicine

## 2018-12-06 ENCOUNTER — Encounter: Payer: Self-pay | Admitting: Family Medicine

## 2018-12-06 VITALS — BP 145/83 | HR 65 | Temp 98.4°F | Resp 16 | Ht 71.0 in | Wt 320.0 lb

## 2018-12-06 DIAGNOSIS — I10 Essential (primary) hypertension: Secondary | ICD-10-CM

## 2018-12-06 DIAGNOSIS — M1711 Unilateral primary osteoarthritis, right knee: Secondary | ICD-10-CM

## 2018-12-06 DIAGNOSIS — E782 Mixed hyperlipidemia: Secondary | ICD-10-CM | POA: Diagnosis not present

## 2018-12-06 DIAGNOSIS — Z125 Encounter for screening for malignant neoplasm of prostate: Secondary | ICD-10-CM

## 2018-12-06 DIAGNOSIS — Z0000 Encounter for general adult medical examination without abnormal findings: Secondary | ICD-10-CM

## 2018-12-06 DIAGNOSIS — Z6841 Body Mass Index (BMI) 40.0 and over, adult: Secondary | ICD-10-CM

## 2018-12-06 DIAGNOSIS — L409 Psoriasis, unspecified: Secondary | ICD-10-CM

## 2018-12-06 DIAGNOSIS — Z1211 Encounter for screening for malignant neoplasm of colon: Secondary | ICD-10-CM

## 2018-12-06 MED ORDER — AMLODIPINE BESYLATE 5 MG PO TABS: 5.0000 mg | ORAL_TABLET | Freq: Every day | ORAL | 1 refills | Status: DC

## 2018-12-06 NOTE — Progress Notes (Signed)
Subjective:    Patient ID: Maurice Rogers, male    DOB: [birth year], 47 y.o.   MRN: [id number]  Maurice Rogers is a 47 y.o. male presenting on 12/06/2018 for Annual Exam   HPI   Here for Annual Physical and Lab Review.  CHRONIC HTN Interval update his home BP readings avg 130-140, often having slightly elevated BP, he is concerned may have new dx HTN and may need medicine. - Improved BP in past when weight was lower into 240 lbs etc, he had difficulty with knee problem and his activity reduced - Diet has not helped him as much - Today he admits ready to start BP medication Current Meds - None Lifestyle: - Exercise: Less regular exercise, see below on weight  Knee pain arthritis Not on NSAID anymore, off meloxicam, history of injection and PT in past  Carpal Tunnel S/p surgery improved 02/2018. Now asymptomatic.  Psoriasis, skin rash He reports chronic issue w/ psoriasis, mostly involving elbows and knees with thicker dry skin rash. He has fam history of psoriasis. Not interested in other treatment at this time. He has not had issues with psoriatic arthritis, not using topical steroid. Not interested to see Dermatologist.  Abnormal Weight Gain / Morbid Obesity BMI >44 / HYPERLIPIDEMIA - Reports concern of gradual weight gain still >1 year, approx +25 lbs, he attributes to less physical activity and more sedentary lifestyle. Now R knee pain is much improved after followed by ortho and injection/NSAID treatment. Lifestyle: - Diet: diet has been improved previously, not always adhering to reduced portions but seems to have improved food choices/ content - Exercise: He admits needs to resume going back to gym regularly - Regarding labs, last lipid 07/2017 had mild elevated TG and low HDL, overall improved. Never on Statin therapy. Not on ASA   Health Maintenance:  Colon CA Screening: Never had colonoscopy or colon cancer screening. Currently asymptomatic. No known family history  of colon CA. Due for screening test interested in Cologuard. Agrees for order today  Prostate CA Screening: No prior prostate CA screening. Currently asymptomatic except occasional nocturia, no dx of BPH LUTS. No known family history of prostate CA. Due for screening.  UTD Flu Vaccine 09/2018   Depression screen [facility] 2/9 12/06/2018 07/20/2017 07/17/2016  Decreased Interest 0 0 0  Down, Depressed, Hopeless 0 0 0  PHQ - 2 Score 0 0 0    Past Medical History:  Diagnosis Date  . Obesity    Past Surgical History:  Procedure Laterality Date  . CARPAL TUNNEL RELEASE Right 02/18/2018   Procedure: CARPAL TUNNEL RELEASE;  Surgeon: Kennedy Bucker, MD;  Location: [facility] ORS;  Service: Orthopedics;  Laterality: Right;  . FOOT SURGERY Right 1997  . HERNIA REPAIR Bilateral 1992  . NASAL SINUS SURGERY    . TRACHEOSTOMY     @ 18 MONTHS OLD DUE TO CROUP   Social History   Socioeconomic History  . Marital status: Divorced    Spouse name: Not on file  . Number of children: Not on file  . Years of education: Not on file  . Highest education level: Not on file  Occupational History  . Not on file  Social Needs  . Financial resource strain: Not on file  . Food insecurity:    Worry: Not on file    Inability: Not on file  . Transportation needs:    Medical: Not on file    Non-medical: Not on file  Tobacco Use  .  Smoking status: Former Smoker    Packs/day: 1.00    Years: 4.00    Pack years: 4.00    Types: Cigarettes    Last attempt to quit: 12/08/2000    Years since quitting: 18.0  . Smokeless tobacco: Former Engineer, waterUser  Substance and Sexual Activity  . Alcohol use: No  . Drug use: No  . Sexual activity: Not Currently  Lifestyle  . Physical activity:    Days per week: Not on file    Minutes per session: Not on file  . Stress: Not on file  Relationships  . Social connections:    Talks on phone: Not on file    Gets together: Not on file    Attends religious service: Not on file    Active  member of club or organization: Not on file    Attends meetings of clubs or organizations: Not on file    Relationship status: Not on file  . Intimate partner violence:    Fear of current or ex partner: Not on file    Emotionally abused: Not on file    Physically abused: Not on file    Forced sexual activity: Not on file  Other Topics Concern  . Not on file  Social History Narrative  . Not on file   Family History  Problem Relation Age of Onset  . Diabetes Mother   . Mental illness Mother        bipolar, depression  . Seizures Mother   . Stroke Mother   . Diabetes Sister   . Diabetes Maternal Grandfather   . CAD Paternal Grandfather   . Hyperlipidemia Paternal Grandfather   . Hypertension Paternal Grandfather   . Heart disease Paternal Grandfather   . Cancer Paternal Grandfather   . Heart disease Father   . Dementia Maternal Grandmother    No current outpatient medications on file prior to visit.   No current facility-administered medications on file prior to visit.     Review of Systems  Constitutional: Negative for activity change, appetite change, chills, diaphoresis, fatigue and fever.  HENT: Negative for congestion and hearing loss.   Eyes: Negative for visual disturbance.  Respiratory: Negative for apnea, cough, choking, chest tightness, shortness of breath and wheezing.   Cardiovascular: Negative for chest pain, palpitations and leg swelling.  Gastrointestinal: Negative for abdominal pain, anal bleeding, blood in stool, constipation, diarrhea, nausea and vomiting.  Endocrine: Negative for cold intolerance.  Genitourinary: Negative for decreased urine volume, difficulty urinating, dysuria, frequency, hematuria and urgency.  Musculoskeletal: Negative for arthralgias, back pain and neck pain.  Skin: Negative for rash.  Allergic/Immunologic: Negative for environmental allergies.  Neurological: Negative for dizziness, weakness, light-headedness, numbness and  headaches.  Hematological: Negative for adenopathy.  Psychiatric/Behavioral: Negative for behavioral problems, dysphoric mood and sleep disturbance. The patient is not nervous/anxious.    Per HPI unless specifically indicated above     Objective:    BP (!) 145/83   Pulse 65   Temp 98.4 F (36.9 C) (Oral)   Resp 16   Ht 5\' 11"  (1.803 m)   Wt (!) 320 lb (145.2 kg)   BMI 44.63 kg/m   Wt Readings from Last 3 Encounters:  12/06/18 (!) 320 lb (145.2 kg)  02/11/18 (!) 312 lb (141.5 kg)  11/18/17 (!) 305 lb (138.3 kg)    Physical Exam Vitals signs and nursing note reviewed.  Constitutional:      General: He is not in acute distress.    Appearance:  He is well-developed. He is not diaphoretic.     Comments: Well-appearing, comfortable, cooperative  HENT:     Head: Normocephalic and atraumatic.     Comments: Frontal / maxillary sinuses non-tender. Nares patent without purulence or edema. Bilateral TMs clear without erythema, effusion or bulging. Oropharynx clear without erythema, exudates, edema or asymmetry. Eyes:     General:        Right eye: No discharge.        Left eye: No discharge.     Conjunctiva/sclera: Conjunctivae normal.     Pupils: Pupils are equal, round, and reactive to light.  Neck:     Musculoskeletal: Normal range of motion and neck supple.     Thyroid: No thyromegaly.  Cardiovascular:     Rate and Rhythm: Normal rate and regular rhythm.     Heart sounds: Normal heart sounds. No murmur.  Pulmonary:     Effort: Pulmonary effort is normal. No respiratory distress.     Breath sounds: Normal breath sounds. No wheezing or rales.  Abdominal:     General: Bowel sounds are normal. There is no distension.     Palpations: Abdomen is soft. There is no mass.     Tenderness: There is no abdominal tenderness.  Musculoskeletal: Normal range of motion.        General: No tenderness.     Comments: Upper / Lower Extremities: - Normal muscle tone, strength bilateral upper  extremities 5/5, lower extremities 5/5  Lymphadenopathy:     Cervical: No cervical adenopathy.  Skin:    General: Skin is warm and dry.     Findings: Rash (psoriasis rash on flexural surfaces elbows knees with dry thickened plaque skin) present. No erythema.  Neurological:     Mental Status: He is alert and oriented to person, place, and time.     Comments: Distal sensation intact to light touch all extremities  Psychiatric:        Behavior: Behavior normal.     Comments: Well groomed, good eye contact, normal speech and thoughts    Results for orders placed or performed during the hospital encounter of 02/15/18  Basic metabolic panel  Result Value Ref Range   Sodium 136 135 - 145 mmol/L   Potassium 3.8 3.5 - 5.1 mmol/L   Chloride 103 101 - 111 mmol/L   CO2 23 22 - 32 mmol/L   Glucose, Bld 159 (H) 65 - 99 mg/dL   BUN 10 6 - 20 mg/dL   Creatinine, Ser 1.610.87 0.61 - 1.24 mg/dL   Calcium 8.8 (L) 8.9 - 10.3 mg/dL   GFR calc non Af Amer >60 >60 mL/min   GFR calc Af Amer >60 >60 mL/min   Anion gap 10 5 - 15      Assessment & Plan:   Problem List Items Addressed This Visit    Essential hypertension    New diagnosis of HTN, Mildly elevated initial BP - Home BP readings avg elevated 140s SBP No known complications   Plan:  1. Discussion on HTN today including treatment - start new medication Amlodipine 5mg  daily 2. Encourage improved lifestyle - low sodium diet, regular exercise 3. Keep monitor BP outside office, bring readings to next visit - may need to adjust med up or down pending progress 4. Follow-up q 6 months HTN med adjust - future consider ACEi/ARB if elevated sugar, may titrate off anti hypertension med if wt down and lifestyle improve      Relevant Medications  amLODipine (NORVASC) 5 MG tablet   Hyperlipidemia    Due for repeat fasting lipid now Last lipid panel 07/2017 Calculated ASCVD 10 yr risk score 3.1% (avg 1.7)  Plan: 1. Counseling on ASCVD risk -  re-evaluate 2. Encourage improved lifestyle - low carb/cholesterol, reduce portion size, restat regular exercise      Relevant Medications   amLODipine (NORVASC) 5 MG tablet   Morbid obesity with BMI of 40.0-44.9, adult (HCC)    Wt gain gradually , poor lifestyle limited exercise Counseling on improvement Follow-up as planned      Osteoarthritis of right knee    Stable without flare Followed by Ortho, improved now      Psoriasis    Clinically with psoriasis affecting flexural surfaces derm involvement only Cannot rule out any psoriatic arthritic component, but clinically seems less likely, and patient not interested in 2nd opinion  Plan Advised may try OTC topical moisturizers and barrier cream for rash He declined topical steroid rx at this time Declined Derm referral       Other Visit Diagnoses    Annual physical exam    -  Primary   Screen for colon cancer       Relevant Orders   Cologuard      Updated Health Maintenance information Ordered Cologuard - counseling on screening - he will check cost/coverage Ordered PSA lab, counseling on prostate cancer screening Ordered future labs for 1 week - will review w/ patient Encouraged improvement to lifestyle with diet and exercise -Goal of weight loss   Meds ordered this encounter  Medications  . amLODipine (NORVASC) 5 MG tablet    Sig: Take 1 tablet (5 mg total) by mouth daily.    Dispense:  90 tablet    Refill:  1     Follow up plan: Return in about 6 months (around 06/07/2019) for 6 months HTN med adjust.   Future labs next week 12/13/18  Saralyn Pilar, DO [facility] 12/06/2018, 2:03 PM

## 2018-12-06 NOTE — Patient Instructions (Addendum)
Thank you for coming to the office today.  BP still mild elevated - new diagnosis of Hypertension  Start low dose Amlodipine 49m daily- keep track of BP on new med, if still >140/90 on average, may need to double dose up to 177mor we can consider additional med or switch - in future if too low BP < 100 or light headed or dizzy on new medicine, can cut in half - goal to work on improving lifestyle, exercise, activity wt loss and may no longer need med in future  We will check PSA for prostate with labs  For psoriasis - may use topical moisturizer or over the counter barrier cream or ointment options - if need stronger topical steroid let me know - we can send this in - if ready for dermatology referral let me know  Additionally we will check cholesterol, sugar, etc, stay tuned for results  Colon Cancer Screening: - For all adults age 49+outine colon cancer screening is highly recommended.     - Recent guidelines from [employer]commend starting age of 49 Early detection of colon cancer is important, because often there are no warning signs or symptoms, also if found early usually it can be cured. Late stage is hard to treat.  - If you are not interested in Colonoscopy screening (if done and normal you could be cleared for 5 to 10 years until next due), then Cologuard is an excellent alternative for screening test for Colon Cancer. It is highly sensitive for detecting DNA of colon cancer from even the earliest stages. Also, there is NO bowel prep required. - If Cologuard is NEGATIVE, then it is good for 3 years before next due - If Cologuard is POSITIVE, then it is strongly advised to get a Colonoscopy, which allows the GI doctor to locate the source of the cancer or polyp (even very early stage) and treat it by removing it. ------------------------- If you would like to proceed with Cologuard (stool DNA test) - FIRST, call your insurance company and tell them you want to  check cost of Cologuard tell them CPT Code [id number] may be completely covered and you could get for no cost, OR max cost without any coverage is about $600). Also, keep in mind if you do NOT open the kit, and decide not to do the test, you will NOT be charged, you should contact the company if you decide not to do the test.  ORDERED TODAY  Follow instructions to collect sample, you may call the company for any help or questions, 24/7 telephone support at [phone]  DUE for FASTING BLOOD WORK (no food or drink after midnight before the lab appointment, only water or coffee without cream/sugar on the morning of)  SCHEDULE "Lab Only" visit in the morning at the clinic for lab draw in 1-2 WEEKS   - Make sure Lab Only appointment is at about 1 week before your next appointment, so that results will be available  For Lab Results, once available within 2-3 days of blood draw, you can can log in to MyChart online to view your results and a brief explanation. Also, we can discuss results at next follow-up visit.   Please schedule a Follow-up Appointment to: Return in about 6 months (around 06/07/2019) for 6 months HTN med adjust.  If you have any other questions or concerns, please feel free to call the office or send a message through [place] may also schedule an  earlier appointment if necessary.  Additionally, you may be receiving a survey about your experience at our office within a few days to 1 week by e-mail or mail. We value your feedback.  Nobie Putnam, DO [place]

## 2018-12-07 NOTE — Assessment & Plan Note (Signed)
Stable without flare Followed by Ortho, improved now

## 2018-12-07 NOTE — Assessment & Plan Note (Addendum)
Due for repeat fasting lipid now Last lipid panel 07/2017 Calculated ASCVD 10 yr risk score 3.1% (avg 1.7)  Plan: 1. Counseling on ASCVD risk - re-evaluate 2. Encourage improved lifestyle - low carb/cholesterol, reduce portion size, restat regular exercise

## 2018-12-07 NOTE — Assessment & Plan Note (Signed)
Wt gain gradually , poor lifestyle limited exercise Counseling on improvement Follow-up as planned

## 2018-12-07 NOTE — Assessment & Plan Note (Signed)
New diagnosis of HTN, Mildly elevated initial BP - Home BP readings avg elevated 140s SBP No known complications   Plan:  1. Discussion on HTN today including treatment - start new medication Amlodipine 5mg  daily 2. Encourage improved lifestyle - low sodium diet, regular exercise 3. Keep monitor BP outside office, bring readings to next visit - may need to adjust med up or down pending progress 4. Follow-up q 6 months HTN med adjust - future consider ACEi/ARB if elevated sugar, may titrate off anti hypertension med if wt down and lifestyle improve

## 2018-12-07 NOTE — Assessment & Plan Note (Signed)
Clinically with psoriasis affecting flexural surfaces derm involvement only Cannot rule out any psoriatic arthritic component, but clinically seems less likely, and patient not interested in 2nd opinion  Plan Advised may try OTC topical moisturizers and barrier cream for rash He declined topical steroid rx at this time Declined Derm referral

## 2018-12-13 ENCOUNTER — Other Ambulatory Visit: Payer: BC Managed Care – PPO

## 2018-12-13 DIAGNOSIS — Z125 Encounter for screening for malignant neoplasm of prostate: Secondary | ICD-10-CM

## 2018-12-13 DIAGNOSIS — E782 Mixed hyperlipidemia: Secondary | ICD-10-CM

## 2018-12-13 DIAGNOSIS — Z6841 Body Mass Index (BMI) 40.0 and over, adult: Secondary | ICD-10-CM

## 2018-12-13 DIAGNOSIS — Z0000 Encounter for general adult medical examination without abnormal findings: Secondary | ICD-10-CM

## 2018-12-13 DIAGNOSIS — I10 Essential (primary) hypertension: Secondary | ICD-10-CM

## 2018-12-13 DIAGNOSIS — L409 Psoriasis, unspecified: Secondary | ICD-10-CM

## 2018-12-14 LAB — COMPLETE METABOLIC PANEL WITH GFR
AG Ratio: 1.6 (calc) (ref 1.0–2.5)
ALKALINE PHOSPHATASE (APISO): 86 U/L (ref 40–115)
ALT: 22 U/L (ref 9–46)
AST: 15 U/L (ref 10–40)
Albumin: 4.2 g/dL (ref 3.6–5.1)
BUN: 9 mg/dL (ref 7–25)
CO2: 29 mmol/L (ref 20–32)
Calcium: 9.1 mg/dL (ref 8.6–10.3)
Chloride: 101 mmol/L (ref 98–110)
Creat: 0.79 mg/dL (ref 0.60–1.35)
GFR, Est African American: 122 mL/min/{1.73_m2} (ref >=60)
GFR, Est Non African American: 105 mL/min/{1.73_m2} (ref >=60)
Globulin: 2.6 g/dL (calc) (ref 1.9–3.7)
Glucose, Bld: 140 mg/dL — ABNORMAL HIGH (ref 65–99)
Potassium: 4.5 mmol/L (ref 3.5–5.3)
Sodium: 136 mmol/L (ref 135–146)
Total Bilirubin: 0.8 mg/dL (ref 0.2–1.2)
Total Protein: 6.8 g/dL (ref 6.1–8.1)

## 2018-12-14 LAB — LIPID PANEL
Cholesterol: 167 mg/dL (ref <200)
HDL: 31 mg/dL — ABNORMAL LOW (ref >40)
LDL Cholesterol (Calc): 108 mg/dL (calc) — ABNORMAL HIGH
Non-HDL Cholesterol (Calc): 136 mg/dL (calc) — ABNORMAL HIGH (ref <130)
TRIGLYCERIDES: 162 mg/dL — AB (ref <150)
Total CHOL/HDL Ratio: 5.4 (calc) — ABNORMAL HIGH (ref <5.0)

## 2018-12-14 LAB — CBC WITH DIFFERENTIAL/PLATELET
Absolute Monocytes: 461 cells/uL (ref 200–950)
BASOS ABS: 29 {cells}/uL (ref 0–200)
Basophils Relative: 0.6 %
Eosinophils Absolute: 152 cells/uL (ref 15–500)
Eosinophils Relative: 3.1 %
HCT: 43.6 % (ref 38.5–50.0)
Hemoglobin: 15 g/dL (ref 13.2–17.1)
Lymphs Abs: 1642 cells/uL (ref 850–3900)
MCH: 28.2 pg (ref 27.0–33.0)
MCHC: 34.4 g/dL (ref 32.0–36.0)
MCV: 82 fL (ref 80.0–100.0)
MPV: 11.1 fL (ref 7.5–12.5)
Monocytes Relative: 9.4 %
NEUTROS PCT: 53.4 %
Neutro Abs: 2617 cells/uL (ref 1500–7800)
Platelets: 180 10*3/uL (ref 140–400)
RBC: 5.32 10*6/uL (ref 4.20–5.80)
RDW: 13.3 % (ref 11.0–15.0)
Total Lymphocyte: 33.5 %
WBC: 4.9 10*3/uL (ref 3.8–10.8)

## 2018-12-14 LAB — PSA: PSA: 0.4 ng/mL (ref <=4.0)

## 2018-12-14 LAB — TSH: TSH: 5.92 m[IU]/L — AB (ref 0.40–4.50)

## 2018-12-14 LAB — HEMOGLOBIN A1C
HEMOGLOBIN A1C: 5.8 %{Hb} — AB (ref <5.7)
Mean Plasma Glucose: 120 (calc)
eAG (mmol/L): 6.6 (calc)

## 2018-12-16 ENCOUNTER — Other Ambulatory Visit: Payer: Self-pay | Admitting: Family Medicine

## 2018-12-16 ENCOUNTER — Encounter: Payer: Self-pay | Admitting: Family Medicine

## 2018-12-16 DIAGNOSIS — R7309 Other abnormal glucose: Secondary | ICD-10-CM | POA: Insufficient documentation

## 2018-12-16 DIAGNOSIS — Z6841 Body Mass Index (BMI) 40.0 and over, adult: Secondary | ICD-10-CM

## 2018-12-16 DIAGNOSIS — R7989 Other specified abnormal findings of blood chemistry: Secondary | ICD-10-CM | POA: Insufficient documentation

## 2018-12-16 DIAGNOSIS — E782 Mixed hyperlipidemia: Secondary | ICD-10-CM

## 2019-02-03 LAB — COLOGUARD: Cologuard: NEGATIVE

## 2019-02-04 ENCOUNTER — Encounter: Payer: Self-pay | Admitting: Family Medicine

## 2019-02-04 ENCOUNTER — Telehealth: Payer: Self-pay | Admitting: Family Medicine

## 2019-02-04 NOTE — Telephone Encounter (Signed)
Please notify patient of Cologuard result:   It is NEGATIVE. This is a good result, meaning that it is very unlikely to have any abnormal colon polyps or colon cancer.   Next due for cologuard test in 3 years - 01/2022  Saralyn Pilar, DO [facility] 02/04/2019, 5:35 PM

## 2019-02-07 NOTE — Telephone Encounter (Signed)
A letter has been mailed to the patient. 

## 2019-06-03 ENCOUNTER — Other Ambulatory Visit: Payer: Self-pay | Admitting: Family Medicine

## 2019-06-03 DIAGNOSIS — I10 Essential (primary) hypertension: Secondary | ICD-10-CM

## 2019-06-06 ENCOUNTER — Encounter: Payer: Self-pay | Admitting: Family Medicine

## 2019-06-06 ENCOUNTER — Other Ambulatory Visit: Payer: Self-pay

## 2019-06-06 ENCOUNTER — Ambulatory Visit (INDEPENDENT_AMBULATORY_CARE_PROVIDER_SITE_OTHER): Payer: BC Managed Care – PPO | Admitting: Family Medicine

## 2019-06-06 DIAGNOSIS — R7309 Other abnormal glucose: Secondary | ICD-10-CM | POA: Diagnosis not present

## 2019-06-06 DIAGNOSIS — R7989 Other specified abnormal findings of blood chemistry: Secondary | ICD-10-CM

## 2019-06-06 DIAGNOSIS — I10 Essential (primary) hypertension: Secondary | ICD-10-CM | POA: Diagnosis not present

## 2019-06-06 MED ORDER — LOSARTAN POTASSIUM 50 MG PO TABS: 50.0000 mg | ORAL_TABLET | Freq: Every day | ORAL | 1 refills | Status: DC

## 2019-06-06 NOTE — Progress Notes (Signed)
Virtual Visit via Telephone The purpose of this virtual visit is to provide medical care while limiting exposure to the novel coronavirus (COVID19) for both patient and office staff.  Consent was obtained for phone visit:  Yes.   Answered questions that patient had about telehealth interaction:  Yes.   I discussed the limitations, risks, security and privacy concerns of performing an evaluation and management service by telephone. I also discussed with the patient that there may be a patient responsible charge related to this service. The patient expressed understanding and agreed to proceed.  Patient Location: Home Provider Location: [facility])  ---------------------------------------------------------------------- Chief Complaint  Patient presents with  . Hypertension    ava 140/95 mm/hg     S: Reviewed CMA documentation. I have called patient and gathered additional HPI as follows:  CHRONIC HTN - Last visit with me 11/2018, for same problem during annual physical, treated with Amlodipine 5mg  new start, see prior notes for background information. - Interval update with home BP cuff readings without improvement on medicine, and then even when increased BP med in February to 10mg  daily, without any improvement - Today patient reports he has remained off BP medication amlodipine since April 2020 due to ineffective, his BP as remained same Lifestyle - Weight unchanged, unable to lose - Diet - stable without change - Exercise - limited due to not able to go to gym Denies CP, dyspnea, HA, edema, dizziness / lightheadedness  Elevated TSH Last lab 12/2018 showed mild elevated TSH. No prior abnormality. Never treated. Due for repeat lab.  Elevated A1c Last A1c in 12/2018 borderline elevated at 5.8, no prior dx of PreDM. Due for repeat A1c now Meds: none Denies hypoglycemia, polyuria, visual changes, numbness or tingling.   Past Medical History:  Diagnosis Date   . Obesity    Social History   Tobacco Use  . Smoking status: Former Smoker    Packs/day: 1.00    Years: 4.00    Pack years: 4.00    Types: Cigarettes    Quit date: 12/08/2000    Years since quitting: 18.5  . Smokeless tobacco: Former Network engineer Use Topics  . Alcohol use: No  . Drug use: No    Current Outpatient Medications:  .  losartan (COZAAR) 50 MG tablet, Take 1 tablet (50 mg total) by mouth daily., Disp: 90 tablet, Rfl: 1  Depression screen [facility] 2/9 06/06/2019 12/06/2018 07/20/2017  Decreased Interest 0 0 0  Down, Depressed, Hopeless 0 0 0  PHQ - 2 Score 0 0 0    No flowsheet data found.  -------------------------------------------------------------------------- O: No physical exam performed due to remote telephone encounter.  Lab results reviewed.  No results found for this or any previous visit (from the past 2160 hour(s)).  -------------------------------------------------------------------------- A&P:  Problem List Items Addressed This Visit    Elevated hemoglobin A1c    Mild elevated A1c up to 5.8, at risk of PreDM Obesity, HTN  Plan Improve lifestyle Re-check A1c in 1 month with other labs      Relevant Orders   Hemoglobin A1c   Elevated TSH    Last lab elevated TSH mild in 12/2018 Has HTN, weight gain Not significant enough for treatment for hypothyroidism Will re-check thyroid panel in 1 month, upcoming      Relevant Orders   TSH   T4, free   Essential hypertension - Primary    Mild elevated BP, ineffective Amlodipine up to 10mg  - Home BP readings avg  elevated 140s SBP No known complications   Plan:  1. Discontinued amlodipine already - will switch to ARB Losartan 50mg  daily, counseling provided - Return in 4 weeks for fasting lab only chemistry for K / Cr on ARB 2. Encourage improved lifestyle - low sodium diet, regular exercise 3. Keep monitor BP outside office, bring readings to next visit - may need to adjust med up or down  pending progress 4. Follow-up q 6 months for annual      Relevant Medications   losartan (COZAAR) 50 MG tablet   Other Relevant Orders   BASIC METABOLIC PANEL WITH GFR      Meds ordered this encounter  Medications  . losartan (COZAAR) 50 MG tablet    Sig: Take 1 tablet (50 mg total) by mouth daily.    Dispense:  90 tablet    Refill:  1    Discontinue Amlodipine    Follow-up: - Return in 1 month for fasting blood work - then 6 months for Annual Physical  Future labs ordered for 06/2019  Patient verbalizes understanding with the above medical recommendations including the limitation of remote medical advice.  Specific follow-up and call-back criteria were given for patient to follow-up or seek medical care more urgently if needed.   - Time spent in direct consultation with patient on phone: 15 minutes   Saralyn PilarAlexander Karamalegos, DO [facility] 06/06/2019, 8:13 AM

## 2019-06-06 NOTE — Patient Instructions (Addendum)
Start Losartan 50mg  daily - this should control BP, keep track of readings  Additional information on this medication to be aware of - similar to other medication side effects, rarely it can cause facial swelling and other concerning reaction allergy, if you do get sudden swelling important to stop medication and contact office or seek care.  We may need to increase to 100mg  by next apt if not working at this dose, can call us back.  DUE for FASTING BLOOD WORK (no food or drink after midnight before the lab appointment, only water or coffee without cream/sugar on the morning of)  SCHEDULE "Lab Only" visit in the morning at the clinic for lab draw in 1 MONTHS  - then again in 6 months  - Make sure Lab Only appointment is at about 1 week before your next appointment, so that results will be available  For Lab Results, once available within 2-3 days of blood draw, you can can log in to MyChart online to view your results and a brief explanation. Also, we can discuss results at next follow-up visit.    Please schedule a Follow-up Appointment to: Return in about 6 months (around 12/06/2019) for Annual Physical.  If you have any other questions or concerns, please feel free to call the office or send a message through [place]. You may also schedule an earlier appointment if necessary.  Additionally, you may be receiving a survey about your experience at our office within a few days to 1 week by e-mail or mail. We value your feedback.  Nobie Putnam, DO [place]

## 2019-06-06 NOTE — Assessment & Plan Note (Addendum)
Mild elevated BP, ineffective Amlodipine up to 10mg  - Home BP readings avg elevated 140s SBP No known complications   Plan:  1. Discontinued amlodipine already - will switch to ARB Losartan 50mg  daily, counseling provided - Return in 4 weeks for fasting lab only chemistry for K / Cr on ARB 2. Encourage improved lifestyle - low sodium diet, regular exercise 3. Keep monitor BP outside office, bring readings to next visit - may need to adjust med up or down pending progress 4. Follow-up q 6 months for annual

## 2019-06-06 NOTE — Assessment & Plan Note (Signed)
Last lab elevated TSH mild in 12/2018 Has HTN, weight gain Not significant enough for treatment for hypothyroidism Will re-check thyroid panel in 1 month, upcoming

## 2019-06-06 NOTE — Assessment & Plan Note (Signed)
Mild elevated A1c up to 5.8, at risk of PreDM Obesity, HTN  Plan Improve lifestyle Re-check A1c in 1 month with other labs

## 2019-07-06 ENCOUNTER — Other Ambulatory Visit: Payer: BC Managed Care – PPO

## 2019-07-06 DIAGNOSIS — I10 Essential (primary) hypertension: Secondary | ICD-10-CM

## 2019-07-06 DIAGNOSIS — R7989 Other specified abnormal findings of blood chemistry: Secondary | ICD-10-CM

## 2019-07-06 DIAGNOSIS — R7309 Other abnormal glucose: Secondary | ICD-10-CM

## 2019-07-07 LAB — BASIC METABOLIC PANEL WITH GFR
BUN: 10 mg/dL (ref 7–25)
CO2: 28 mmol/L (ref 20–32)
Calcium: 9 mg/dL (ref 8.6–10.3)
Chloride: 101 mmol/L (ref 98–110)
Creat: 0.86 mg/dL (ref 0.70–1.33)
GFR, Est African American: 117 mL/min/{1.73_m2} (ref >=60)
GFR, Est Non African American: 101 mL/min/{1.73_m2} (ref >=60)
Glucose, Bld: 141 mg/dL — ABNORMAL HIGH (ref 65–99)
Potassium: 4.1 mmol/L (ref 3.5–5.3)
Sodium: 136 mmol/L (ref 135–146)

## 2019-07-07 LAB — HEMOGLOBIN A1C
Hgb A1c MFr Bld: 5.6 % of total Hgb (ref <5.7)
Mean Plasma Glucose: 114 (calc)
eAG (mmol/L): 6.3 (calc)

## 2019-07-07 LAB — TSH: TSH: 5.24 mIU/L — ABNORMAL HIGH (ref 0.40–4.50)

## 2019-07-07 LAB — T4, FREE: Free T4: 1.4 ng/dL (ref 0.8–1.8)

## 2019-07-12 ENCOUNTER — Other Ambulatory Visit: Payer: Self-pay | Admitting: Family Medicine

## 2019-07-12 DIAGNOSIS — Z0000 Encounter for general adult medical examination without abnormal findings: Secondary | ICD-10-CM

## 2019-07-12 DIAGNOSIS — R7309 Other abnormal glucose: Secondary | ICD-10-CM

## 2019-07-12 DIAGNOSIS — I10 Essential (primary) hypertension: Secondary | ICD-10-CM

## 2019-07-12 DIAGNOSIS — E782 Mixed hyperlipidemia: Secondary | ICD-10-CM

## 2019-07-12 DIAGNOSIS — R7989 Other specified abnormal findings of blood chemistry: Secondary | ICD-10-CM

## 2019-11-29 ENCOUNTER — Other Ambulatory Visit: Payer: BC Managed Care – PPO

## 2019-11-29 ENCOUNTER — Other Ambulatory Visit: Payer: Self-pay

## 2019-11-29 DIAGNOSIS — R7989 Other specified abnormal findings of blood chemistry: Secondary | ICD-10-CM

## 2019-11-29 DIAGNOSIS — E782 Mixed hyperlipidemia: Secondary | ICD-10-CM

## 2019-11-29 DIAGNOSIS — I10 Essential (primary) hypertension: Secondary | ICD-10-CM

## 2019-11-29 DIAGNOSIS — R7309 Other abnormal glucose: Secondary | ICD-10-CM

## 2019-11-29 DIAGNOSIS — Z0000 Encounter for general adult medical examination without abnormal findings: Secondary | ICD-10-CM

## 2019-11-30 LAB — COMPLETE METABOLIC PANEL WITH GFR
AG Ratio: 2.1 (calc) (ref 1.0–2.5)
ALT: 28 U/L (ref 9–46)
AST: 19 U/L (ref 10–35)
Albumin: 4.1 g/dL (ref 3.6–5.1)
Alkaline phosphatase (APISO): 63 U/L (ref 35–144)
BUN: 7 mg/dL (ref 7–25)
CO2: 27 mmol/L (ref 20–32)
Calcium: 8.6 mg/dL (ref 8.6–10.3)
Chloride: 104 mmol/L (ref 98–110)
Creat: 0.81 mg/dL (ref 0.70–1.33)
GFR, Est African American: 120 mL/min/{1.73_m2} (ref >=60)
GFR, Est Non African American: 104 mL/min/{1.73_m2} (ref >=60)
Globulin: 2 g/dL (calc) (ref 1.9–3.7)
Glucose, Bld: 143 mg/dL — ABNORMAL HIGH (ref 65–99)
Potassium: 4.2 mmol/L (ref 3.5–5.3)
Sodium: 138 mmol/L (ref 135–146)
Total Bilirubin: 0.7 mg/dL (ref 0.2–1.2)
Total Protein: 6.1 g/dL (ref 6.1–8.1)

## 2019-11-30 LAB — CBC WITH DIFFERENTIAL/PLATELET
Absolute Monocytes: 292 cells/uL (ref 200–950)
Basophils Absolute: 30 cells/uL (ref 0–200)
Basophils Relative: 0.7 %
Eosinophils Absolute: 151 cells/uL (ref 15–500)
Eosinophils Relative: 3.5 %
HCT: 44 % (ref 38.5–50.0)
Hemoglobin: 14.9 g/dL (ref 13.2–17.1)
Lymphs Abs: 1415 cells/uL (ref 850–3900)
MCH: 28.7 pg (ref 27.0–33.0)
MCHC: 33.9 g/dL (ref 32.0–36.0)
MCV: 84.6 fL (ref 80.0–100.0)
MPV: 11.3 fL (ref 7.5–12.5)
Monocytes Relative: 6.8 %
Neutro Abs: 2412 cells/uL (ref 1500–7800)
Neutrophils Relative %: 56.1 %
Platelets: 168 10*3/uL (ref 140–400)
RBC: 5.2 10*6/uL (ref 4.20–5.80)
RDW: 13.1 % (ref 11.0–15.0)
Total Lymphocyte: 32.9 %
WBC: 4.3 10*3/uL (ref 3.8–10.8)

## 2019-11-30 LAB — LIPID PANEL
Cholesterol: 153 mg/dL (ref <200)
HDL: 34 mg/dL — ABNORMAL LOW (ref >=40)
LDL Cholesterol (Calc): 93 mg/dL (calc)
Non-HDL Cholesterol (Calc): 119 mg/dL (calc) (ref <130)
Total CHOL/HDL Ratio: 4.5 (calc) (ref <5.0)
Triglycerides: 160 mg/dL — ABNORMAL HIGH (ref <150)

## 2019-11-30 LAB — HEMOGLOBIN A1C
Hgb A1c MFr Bld: 5.8 % of total Hgb — ABNORMAL HIGH (ref <5.7)
Mean Plasma Glucose: 120 (calc)
eAG (mmol/L): 6.6 (calc)

## 2019-11-30 LAB — TSH: TSH: 4.28 mIU/L (ref 0.40–4.50)

## 2019-11-30 LAB — PSA: PSA: 0.4 ng/mL (ref <=4.0)

## 2019-11-30 LAB — T4, FREE: Free T4: 1.4 ng/dL (ref 0.8–1.8)

## 2019-12-06 ENCOUNTER — Encounter: Payer: BC Managed Care – PPO | Admitting: Family Medicine

## 2019-12-13 ENCOUNTER — Other Ambulatory Visit: Payer: Self-pay

## 2019-12-13 ENCOUNTER — Encounter: Payer: Self-pay | Admitting: Family Medicine

## 2019-12-13 ENCOUNTER — Ambulatory Visit (INDEPENDENT_AMBULATORY_CARE_PROVIDER_SITE_OTHER): Payer: BC Managed Care – PPO | Admitting: Family Medicine

## 2019-12-13 VITALS — BP 145/80 | HR 69 | Temp 97.7°F | Resp 16 | Ht 70.0 in | Wt 320.0 lb

## 2019-12-13 DIAGNOSIS — I10 Essential (primary) hypertension: Secondary | ICD-10-CM | POA: Diagnosis not present

## 2019-12-13 DIAGNOSIS — Z0000 Encounter for general adult medical examination without abnormal findings: Secondary | ICD-10-CM

## 2019-12-13 DIAGNOSIS — Z6841 Body Mass Index (BMI) 40.0 and over, adult: Secondary | ICD-10-CM

## 2019-12-13 DIAGNOSIS — E782 Mixed hyperlipidemia: Secondary | ICD-10-CM

## 2019-12-13 DIAGNOSIS — R7309 Other abnormal glucose: Secondary | ICD-10-CM | POA: Diagnosis not present

## 2019-12-13 DIAGNOSIS — B356 Tinea cruris: Secondary | ICD-10-CM

## 2019-12-13 MED ORDER — KETOCONAZOLE 2 % EX CREA: 1.0000 "application " | TOPICAL_CREAM | Freq: Every day | CUTANEOUS | 1 refills | Status: DC

## 2019-12-13 NOTE — Assessment & Plan Note (Signed)
Mild elevated A1c up to 5.8, at risk of PreDM Obesity, HTN  Plan Improve lifestyle as discussed No medicine

## 2019-12-13 NOTE — Assessment & Plan Note (Signed)
Mild elevated BP, had improved on Losartan ARB but now poor med adherence and wt gain - Home BP readings avg elevated 140s SBP No known complications   Plan:  1. Continue Losartan 50mg  daily for now - no dose change, goal to adhere to med 2. Encourage improved lifestyle - low sodium diet, regular exercise 3. Keep monitor BP outside office, bring readings to next visit

## 2019-12-13 NOTE — Patient Instructions (Addendum)
Thank you for coming to the office today.  Try ketoconazole cream once daily for up to 2 weeks under arm.  Keep trying to improve on the lifestyle as discussed.  Notify if you need me in 6 months to get back on track.  Limit sodas, improve diet/exercise  Adhere to daily losartan medication.  DUE for FASTING BLOOD WORK (no food or drink after midnight before the lab appointment, only water or coffee without cream/sugar on the morning of)  SCHEDULE "Lab Only" visit in the morning at the clinic for lab draw in 1 YEAR  - Make sure Lab Only appointment is at about 1 week before your next appointment, so that results will be available  For Lab Results, once available within 2-3 days of blood draw, you can can log in to MyChart online to view your results and a brief explanation. Also, we can discuss results at next follow-up visit.   Please schedule a Follow-up Appointment to: Return in about 1 year (around 12/12/2020) for Annual Physical.  If you have any other questions or concerns, please feel free to call the office or send a message through MyChart. You may also schedule an earlier appointment if necessary.  Additionally, you may be receiving a survey about your experience at our office within a few days to 1 week by e-mail or mail. We value your feedback.  Saralyn Pilar, DO [facility], [place]

## 2019-12-13 NOTE — Assessment & Plan Note (Signed)
Wt gain still Goals to improve lifestyle diet exercise

## 2019-12-13 NOTE — Assessment & Plan Note (Signed)
Improved LDL to 93, slight elevated TG due to sugar Last lipid panel 11/2019 Calculated ASCVD 10 yr risk score 3.1% (avg 1.7)  Plan: 1. Encourage improved lifestyle - low carb/cholesterol, reduce portion size, restat regular exercise

## 2019-12-13 NOTE — Progress Notes (Signed)
Subjective:    Patient ID: Maurice Rogers, male    DOB: 26-Dec-1970, 49 y.o.   MRN: [id number]  Maurice Rogers is a 49 y.o. male presenting on 12/13/2019 for Annual Exam   HPI   Here for Annual Physical and Lab Review.  CHRONIC HTN Previously tried Amlodipine had poor results on this, ineffective. Switched to Losartan 50mg  in 05/2019, see prior notes - Interval update with home BP cuff readings still avg 140/90 occasionally Today elevated BP Not adhering to med regularly Med Losartan 50mg  daily Lifestyle - Weight unchanged, unable to lose - Diet - stable without change - Exercise - limited due to not able to go to gym Denies CP, dyspnea, HA, edema, dizziness / lightheadedness  Elevated TSH Last lab 11/2019 - normalized Thyroid panel. Previous mild elevated TSH. No prior abnormality. Never treated.   Elevated A1c / Morbid Obesity BMI >45 Prior A1c in 12/2018 borderline elevated at 5.8, he had improved to A1c 5.6 previously, then now back up to 5.8, admits some limited diet interventions, goals to limit and avoid sodas now. Convenience foods. Meds: none Denies hypoglycemia, polyuria, visual changes, numbness or tingling.  Additional complaint  Skin rash Axilla - Reports new problem 10 months or more, ring under arm, not tender, not itching, asking about this.  Health Maintenance:  Colon CA Screening: Never had colonoscopy. UTD Cologuard negative 01/2019. Currently asymptomatic. No known family history of colon CA.   Prostate CA Screening: PSA 0.4 negative (11/2019) Currently asymptomatic except occasional nocturia, no dx of BPH LUTS. No known family history of prostate CA.  UTD Flu Vaccine  Depression screen [facility] 2/9 12/13/2019 06/06/2019 12/06/2018  Decreased Interest 0 0 0  Down, Depressed, Hopeless 0 0 0  PHQ - 2 Score 0 0 0  Altered sleeping 0 - -  Tired, decreased energy 0 - -  Change in appetite 0 - -  Feeling bad or failure about yourself  0 - -  Trouble  concentrating 0 - -  Moving slowly or fidgety/restless 0 - -  Suicidal thoughts 0 - -  PHQ-9 Score 0 - -  Difficult doing work/chores Not difficult at all - -    Past Medical History:  Diagnosis Date  . Obesity    Past Surgical History:  Procedure Laterality Date  . CARPAL TUNNEL RELEASE Right 02/18/2018   Procedure: CARPAL TUNNEL RELEASE;  Surgeon: 12/08/2018, MD;  Location: [facility] ORS;  Service: Orthopedics;  Laterality: Right;  . FOOT SURGERY Right 1997  . HERNIA REPAIR Bilateral 1992  . NASAL SINUS SURGERY    . TRACHEOSTOMY     @ 18 MONTHS OLD DUE TO CROUP   Social History   Socioeconomic History  . Marital status: Divorced    Spouse name: Not on file  . Number of children: Not on file  . Years of education: Not on file  . Highest education level: Not on file  Occupational History  . Not on file  Tobacco Use  . Smoking status: Former Smoker    Packs/day: 1.00    Years: 4.00    Pack years: 4.00    Types: Cigarettes    Quit date: 12/08/2000    Years since quitting: 19.0  . Smokeless tobacco: Former [employer]ucker and Sexual Activity  . Alcohol use: No  . Drug use: No  . Sexual activity: Not Currently  Other Topics Concern  . Not on file  Social History Narrative  . Not on file  Social Determinants of Health   Financial Resource Strain:   . Difficulty of Paying Living Expenses: Not on file  Food Insecurity:   . Worried About Charity fundraiser in the Last Year: Not on file  . Ran Out of Food in the Last Year: Not on file  Transportation Needs:   . Lack of Transportation (Medical): Not on file  . Lack of Transportation (Non-Medical): Not on file  Physical Activity:   . Days of Exercise per Week: Not on file  . Minutes of Exercise per Session: Not on file  Stress:   . Feeling of Stress : Not on file  Social Connections:   . Frequency of Communication with Friends and Family: Not on file  . Frequency of Social Gatherings with Friends and Family: Not on  file  . Attends Religious Services: Not on file  . Active Member of Clubs or Organizations: Not on file  . Attends Archivist Meetings: Not on file  . Marital Status: Not on file  Intimate Partner Violence:   . Fear of Current or Ex-Partner: Not on file  . Emotionally Abused: Not on file  . Physically Abused: Not on file  . Sexually Abused: Not on file   Family History  Problem Relation Age of Onset  . Diabetes Mother   . Mental illness Mother        bipolar, depression  . Seizures Mother   . Stroke Mother   . Diabetes Sister   . Diabetes Maternal Grandfather   . CAD Paternal Grandfather   . Hyperlipidemia Paternal Grandfather   . Hypertension Paternal Grandfather   . Heart disease Paternal Grandfather   . Cancer Paternal Grandfather   . Heart disease Father   . Dementia Maternal Grandmother    Current Outpatient Medications on File Prior to Visit  Medication Sig  . losartan (COZAAR) 50 MG tablet Take 1 tablet (50 mg total) by mouth daily.   No current facility-administered medications on file prior to visit.    Review of Systems  Constitutional: Negative for activity change, appetite change, chills, diaphoresis, fatigue and fever.  HENT: Negative for congestion and hearing loss.   Eyes: Negative for visual disturbance.  Respiratory: Negative for cough, chest tightness, shortness of breath and wheezing.   Cardiovascular: Negative for chest pain, palpitations and leg swelling.  Gastrointestinal: Negative for abdominal pain, anal bleeding, blood in stool, constipation, diarrhea, nausea and vomiting.  Endocrine: Negative for cold intolerance.  Genitourinary: Negative for decreased urine volume, dysuria, frequency, hematuria and testicular pain.  Musculoskeletal: Negative for arthralgias, back pain and neck pain.  Skin: Positive for rash.  Allergic/Immunologic: Negative for environmental allergies.  Neurological: Negative for dizziness, weakness, light-headedness,  numbness and headaches.  Hematological: Negative for adenopathy.  Psychiatric/Behavioral: Negative for behavioral problems, dysphoric mood and sleep disturbance.   Per HPI unless specifically indicated above      Objective:    BP (!) 145/80   Pulse 69   Temp 97.7 F (36.5 C) (Oral)   Resp 16   Ht 5\' 10"  (1.778 m)   Wt (!) 320 lb (145.2 kg)   BMI 45.92 kg/m   Wt Readings from Last 3 Encounters:  12/13/19 (!) 320 lb (145.2 kg)  12/06/18 (!) 320 lb (145.2 kg)  02/11/18 (!) 312 lb (141.5 kg)    Physical Exam Vitals and nursing note reviewed.  Constitutional:      General: He is not in acute distress.    Appearance: He is  well-developed. He is not diaphoretic.     Comments: Well-appearing, comfortable, cooperative, obese  HENT:     Head: Normocephalic and atraumatic.  Eyes:     General:        Right eye: No discharge.        Left eye: No discharge.     Conjunctiva/sclera: Conjunctivae normal.     Pupils: Pupils are equal, round, and reactive to light.  Neck:     Thyroid: No thyromegaly.  Cardiovascular:     Rate and Rhythm: Normal rate and regular rhythm.     Heart sounds: Normal heart sounds. No murmur.  Pulmonary:     Effort: Pulmonary effort is normal. No respiratory distress.     Breath sounds: Normal breath sounds. No wheezing or rales.  Abdominal:     General: Bowel sounds are normal. There is no distension.     Palpations: Abdomen is soft. There is no mass.     Tenderness: There is no abdominal tenderness.  Musculoskeletal:        General: No tenderness. Normal range of motion.     Cervical back: Normal range of motion and neck supple.     Comments: Upper / Lower Extremities: - Normal muscle tone, strength bilateral upper extremities 5/5, lower extremities 5/5  Lymphadenopathy:     Cervical: No cervical adenopathy.  Skin:    General: Skin is warm and dry.     Findings: Rash (left axilla large area of discolored skin with ring like rash under arm and on  chest wall axillary region) present. No erythema.  Neurological:     Mental Status: He is alert and oriented to person, place, and time.     Comments: Distal sensation intact to light touch all extremities  Psychiatric:        Behavior: Behavior normal.     Comments: Well groomed, good eye contact, normal speech and thoughts      Recent Labs    07/06/19 0840 11/29/19 0824  HGBA1C 5.6 5.8*     Results for orders placed or performed in visit on 11/29/19  T4, free  Result Value Ref Range   Free T4 1.4 0.8 - 1.8 ng/dL  TSH  Result Value Ref Range   TSH 4.28 0.40 - 4.50 mIU/L  PSA  Result Value Ref Range   PSA 0.4 < OR = 4.0 ng/mL  Lipid panel  Result Value Ref Range   Cholesterol 153 <200 mg/dL   HDL 34 (L) > OR = 40 mg/dL   Triglycerides 809 (H) <150 mg/dL   LDL Cholesterol (Calc) 93 mg/dL (calc)   Total CHOL/HDL Ratio 4.5 <5.0 (calc)   Non-HDL Cholesterol (Calc) 119 <130 mg/dL (calc)  COMPLETE METABOLIC PANEL WITH GFR  Result Value Ref Range   Glucose, Bld 143 (H) 65 - 99 mg/dL   BUN 7 7 - 25 mg/dL   Creat 9.83 3.82 - 5.05 mg/dL   GFR, Est Non African American 104 > OR = 60 mL/min/1.28m2   GFR, Est African American 120 > OR = 60 mL/min/1.73m2   BUN/Creatinine Ratio NOT APPLICABLE 6 - 22 (calc)   Sodium 138 135 - 146 mmol/L   Potassium 4.2 3.5 - 5.3 mmol/L   Chloride 104 98 - 110 mmol/L   CO2 27 20 - 32 mmol/L   Calcium 8.6 8.6 - 10.3 mg/dL   Total Protein 6.1 6.1 - 8.1 g/dL   Albumin 4.1 3.6 - 5.1 g/dL   Globulin 2.0 1.9 - 3.7  g/dL (calc)   AG Ratio 2.1 1.0 - 2.5 (calc)   Total Bilirubin 0.7 0.2 - 1.2 mg/dL   Alkaline phosphatase (APISO) 63 35 - 144 U/L   AST 19 10 - 35 U/L   ALT 28 9 - 46 U/L  CBC with Differential/Platelet  Result Value Ref Range   WBC 4.3 3.8 - 10.8 Thousand/uL   RBC 5.20 4.20 - 5.80 Million/uL   Hemoglobin 14.9 13.2 - 17.1 g/dL   HCT 41.9 62.2 - 29.7 %   MCV 84.6 80.0 - 100.0 fL   MCH 28.7 27.0 - 33.0 pg   MCHC 33.9 32.0 - 36.0 g/dL     RDW 98.9 21.1 - 94.1 %   Platelets 168 140 - 400 Thousand/uL   MPV 11.3 7.5 - 12.5 fL   Neutro Abs 2,412 1,500 - 7,800 cells/uL   Lymphs Abs 1,415 850 - 3,900 cells/uL   Absolute Monocytes 292 200 - 950 cells/uL   Eosinophils Absolute 151 15 - 500 cells/uL   Basophils Absolute 30 0 - 200 cells/uL   Neutrophils Relative % 56.1 %   Total Lymphocyte 32.9 %   Monocytes Relative 6.8 %   Eosinophils Relative 3.5 %   Basophils Relative 0.7 %  Hemoglobin A1c  Result Value Ref Range   Hgb A1c MFr Bld 5.8 (H) <5.7 % of total Hgb   Mean Plasma Glucose 120 (calc)   eAG (mmol/L) 6.6 (calc)      Assessment & Plan:   Problem List Items Addressed This Visit    Morbid obesity with BMI of 45.0-49.9, adult (HCC)    Wt gain still Goals to improve lifestyle diet exercise      Hyperlipidemia    Improved LDL to 93, slight elevated TG due to sugar Last lipid panel 11/2019 Calculated ASCVD 10 yr risk score 3.1% (avg 1.7)  Plan: 1. Encourage improved lifestyle - low carb/cholesterol, reduce portion size, restat regular exercise      Essential hypertension    Mild elevated BP, had improved on Losartan ARB but now poor med adherence and wt gain - Home BP readings avg elevated 140s SBP No known complications   Plan:  1. Continue Losartan 50mg  daily for now - no dose change, goal to adhere to med 2. Encourage improved lifestyle - low sodium diet, regular exercise 3. Keep monitor BP outside office, bring readings to next visit      Elevated hemoglobin A1c    Mild elevated A1c up to 5.8, at risk of PreDM Obesity, HTN  Plan Improve lifestyle as discussed No medicine       Other Visit Diagnoses    Annual physical exam    -  Primary   Tinea cruris       Relevant Medications   ketoconazole (NIZORAL) 2 % cream      #Tinea Cruris Localized L Axilla Treat with Ketoconazole cream daily x 1-2 week, may repeat.  Updated Health Maintenance information Reviewed recent lab results with  patient Encouraged improvement to lifestyle with diet and exercise - Goal of weight loss   Meds ordered this encounter  Medications  . ketoconazole (NIZORAL) 2 % cream    Sig: Apply 1 application topically daily. For up to 2 weeks    Dispense:  30 g    Refill:  1     Follow up plan: Return in about 1 year (around 12/12/2020) for Annual Physical.  Offered that he may return in 6 months if need based on lifestyle /  adherence, or if BP elevated, we can discuss further intervention, but if improved can defer to 1 year physical.  Saralyn PilarAlexander Aryella Besecker, DO [facility] 12/13/2019, 10:06 AM

## 2019-12-15 ENCOUNTER — Other Ambulatory Visit: Payer: Self-pay | Admitting: Family Medicine

## 2019-12-15 DIAGNOSIS — I10 Essential (primary) hypertension: Secondary | ICD-10-CM

## 2019-12-15 MED ORDER — LOSARTAN POTASSIUM 50 MG PO TABS: 50.0000 mg | ORAL_TABLET | Freq: Every day | ORAL | 3 refills | Status: DC

## 2020-12-04 ENCOUNTER — Other Ambulatory Visit: Payer: BC Managed Care – PPO

## 2020-12-04 ENCOUNTER — Telehealth: Payer: Self-pay | Admitting: Family Medicine

## 2020-12-04 ENCOUNTER — Other Ambulatory Visit: Payer: Self-pay

## 2020-12-04 DIAGNOSIS — E782 Mixed hyperlipidemia: Secondary | ICD-10-CM

## 2020-12-04 DIAGNOSIS — Z0000 Encounter for general adult medical examination without abnormal findings: Secondary | ICD-10-CM

## 2020-12-04 DIAGNOSIS — I10 Essential (primary) hypertension: Secondary | ICD-10-CM

## 2020-12-04 DIAGNOSIS — Z125 Encounter for screening for malignant neoplasm of prostate: Secondary | ICD-10-CM

## 2020-12-04 DIAGNOSIS — Z6841 Body Mass Index (BMI) 40.0 and over, adult: Secondary | ICD-10-CM

## 2020-12-04 DIAGNOSIS — R7989 Other specified abnormal findings of blood chemistry: Secondary | ICD-10-CM

## 2020-12-04 DIAGNOSIS — R7309 Other abnormal glucose: Secondary | ICD-10-CM

## 2020-12-04 NOTE — Telephone Encounter (Signed)
Signed orders.

## 2020-12-05 LAB — COMPLETE METABOLIC PANEL WITH GFR
AG Ratio: 2 (calc) (ref 1.0–2.5)
ALT: 19 U/L (ref 9–46)
AST: 15 U/L (ref 10–35)
Albumin: 3.8 g/dL (ref 3.6–5.1)
Alkaline phosphatase (APISO): 76 U/L (ref 35–144)
BUN: 10 mg/dL (ref 7–25)
CO2: 27 mmol/L (ref 20–32)
Calcium: 8.6 mg/dL (ref 8.6–10.3)
Chloride: 101 mmol/L (ref 98–110)
Creat: 0.92 mg/dL (ref 0.70–1.33)
GFR, Est African American: 111 mL/min/{1.73_m2} (ref >=60)
GFR, Est Non African American: 96 mL/min/{1.73_m2} (ref >=60)
Globulin: 1.9 g/dL (calc) (ref 1.9–3.7)
Glucose, Bld: 259 mg/dL — ABNORMAL HIGH (ref 65–99)
Potassium: 4.3 mmol/L (ref 3.5–5.3)
Sodium: 135 mmol/L (ref 135–146)
Total Bilirubin: 0.6 mg/dL (ref 0.2–1.2)
Total Protein: 5.7 g/dL — ABNORMAL LOW (ref 6.1–8.1)

## 2020-12-05 LAB — CBC WITH DIFFERENTIAL/PLATELET
Absolute Monocytes: 378 cells/uL (ref 200–950)
Basophils Absolute: 30 cells/uL (ref 0–200)
Basophils Relative: 0.5 %
Eosinophils Absolute: 192 cells/uL (ref 15–500)
Eosinophils Relative: 3.2 %
HCT: 45.9 % (ref 38.5–50.0)
Hemoglobin: 15.3 g/dL (ref 13.2–17.1)
Lymphs Abs: 1650 cells/uL (ref 850–3900)
MCH: 28.3 pg (ref 27.0–33.0)
MCHC: 33.3 g/dL (ref 32.0–36.0)
MCV: 84.8 fL (ref 80.0–100.0)
MPV: 11 fL (ref 7.5–12.5)
Monocytes Relative: 6.3 %
Neutro Abs: 3750 cells/uL (ref 1500–7800)
Neutrophils Relative %: 62.5 %
Platelets: 179 10*3/uL (ref 140–400)
RBC: 5.41 10*6/uL (ref 4.20–5.80)
RDW: 13.3 % (ref 11.0–15.0)
Total Lymphocyte: 27.5 %
WBC: 6 10*3/uL (ref 3.8–10.8)

## 2020-12-05 LAB — LIPID PANEL
Cholesterol: 148 mg/dL (ref <200)
HDL: 29 mg/dL — ABNORMAL LOW (ref >=40)
LDL Cholesterol (Calc): 72 mg/dL (calc)
Non-HDL Cholesterol (Calc): 119 mg/dL (calc) (ref <130)
Total CHOL/HDL Ratio: 5.1 (calc) — ABNORMAL HIGH (ref <5.0)
Triglycerides: 393 mg/dL — ABNORMAL HIGH (ref <150)

## 2020-12-05 LAB — TSH+FREE T4: TSH W/REFLEX TO FT4: 4.44 mIU/L (ref 0.40–4.50)

## 2020-12-05 LAB — HEMOGLOBIN A1C
Hgb A1c MFr Bld: 6.7 % of total Hgb — ABNORMAL HIGH (ref <5.7)
Mean Plasma Glucose: 146 mg/dL
eAG (mmol/L): 8.1 mmol/L

## 2020-12-05 LAB — PSA: PSA: 0.41 ng/mL (ref <=4.0)

## 2020-12-14 ENCOUNTER — Encounter: Payer: BC Managed Care – PPO | Admitting: Family Medicine

## 2020-12-26 ENCOUNTER — Encounter: Payer: Self-pay | Admitting: Family Medicine

## 2020-12-26 ENCOUNTER — Other Ambulatory Visit: Payer: Self-pay

## 2020-12-26 ENCOUNTER — Ambulatory Visit (INDEPENDENT_AMBULATORY_CARE_PROVIDER_SITE_OTHER): Payer: BC Managed Care – PPO | Admitting: Family Medicine

## 2020-12-26 VITALS — BP 132/81 | HR 66 | Ht 70.0 in | Wt 330.0 lb

## 2020-12-26 DIAGNOSIS — E782 Mixed hyperlipidemia: Secondary | ICD-10-CM | POA: Diagnosis not present

## 2020-12-26 DIAGNOSIS — R7309 Other abnormal glucose: Secondary | ICD-10-CM | POA: Diagnosis not present

## 2020-12-26 DIAGNOSIS — Z0000 Encounter for general adult medical examination without abnormal findings: Secondary | ICD-10-CM | POA: Diagnosis not present

## 2020-12-26 DIAGNOSIS — I10 Essential (primary) hypertension: Secondary | ICD-10-CM

## 2020-12-26 DIAGNOSIS — Z6841 Body Mass Index (BMI) 40.0 and over, adult: Secondary | ICD-10-CM

## 2020-12-26 NOTE — Patient Instructions (Addendum)
Thank you for coming to the office today.   1. Chemistry - Normal results, including electrolytes, kidney and liver function. - Elevated blood sugar.  2. Hemoglobin A1c (Diabetes screening) - 6.7, very concerned with elevated blood sugar, this is in the range of Type 2 Diabetes.  - Low Carb / Low Starch / Low Sugar - Goal to increase protein / veggies portion size, and reduce carb portions  3. PSA Prostate Cancer Screening - 0.41, negative.  4. TSH Thyroid Function Tests - Normal.  5. CBC Blood Counts - Normal, no anemia, no other significant abnormality  6. Cholesterol - Abnormal elevated Triglycerides 393 and low HDL. High TG is due to elevated blood sugar. Your LDL cholesterol is actually in normal range at 72. We can review further, main goal is treat sugar, but we can consider other options.  Future consider Keto Diet if interested.   [facility] - for sleep study. Check if in network, schedule virtual visit earlier when you are ready to pursue this order.   Please schedule a Follow-up Appointment to: Return in about 4 months (around 04/25/2021) for 4 month PreDM A1c.  If you have any other questions or concerns, please feel free to call the office or send a message through MyChart. You may also schedule an earlier appointment if necessary.  Additionally, you may be receiving a survey about your experience at our office within a few days to 1 week by e-mail or mail. We value your feedback.  Saralyn Pilar, DO [facility], [facility]   Diabetes Mellitus and Nutrition, Adult When you have diabetes, or diabetes mellitus, it is very important to have healthy eating habits because your blood sugar (glucose) levels are greatly affected by what you eat and drink. Eating healthy foods in the right amounts, at about the same times every day, can help you:  Control your blood glucose.  Lower your risk of heart disease.  Improve your blood  pressure.  Reach or maintain a healthy weight. What can affect my meal plan? Every person with diabetes is different, and each person has different needs for a meal plan. Your health care provider may recommend that you work with a dietitian to make a meal plan that is best for you. Your meal plan may vary depending on factors such as:  The calories you need.  The medicines you take.  Your weight.  Your blood glucose, blood pressure, and cholesterol levels.  Your activity level.  Other health conditions you have, such as heart or kidney disease. How do carbohydrates affect me? Carbohydrates, also called carbs, affect your blood glucose level more than any other type of food. Eating carbs naturally raises the amount of glucose in your blood. Carb counting is a method for keeping track of how many carbs you eat. Counting carbs is important to keep your blood glucose at a healthy level, especially if you use insulin or take certain oral diabetes medicines. It is important to know how many carbs you can safely have in each meal. This is different for every person. Your dietitian can help you calculate how many carbs you should have at each meal and for each snack. How does alcohol affect me? Alcohol can cause a sudden decrease in blood glucose (hypoglycemia), especially if you use insulin or take certain oral diabetes medicines. Hypoglycemia can be a life-threatening condition. Symptoms of hypoglycemia, such as sleepiness, dizziness, and confusion, are similar to symptoms of having too much alcohol.  Do not drink alcohol if: ? Your health care provider tells you not to drink. ? You are pregnant, may be pregnant, or are planning to become pregnant.  If you drink alcohol: ? Do not drink on an empty stomach. ? Limit how much you use to:  0-1 drink a day for women.  0-2 drinks a day for men. ? Be aware of how much alcohol is in your drink. In the [place] one drink equals one 12 oz bottle of  beer (355 mL), one 5 oz glass of wine (148 mL), or one 1 oz glass of hard liquor (44 mL). ? Keep yourself hydrated with water, diet soda, or unsweetened iced tea.  Keep in mind that regular soda, juice, and other mixers may contain a lot of sugar and must be counted as carbs. What are tips for following this plan? Reading food labels  Start by checking the serving size on the "Nutrition Facts" label of packaged foods and drinks. The amount of calories, carbs, fats, and other nutrients listed on the label is based on one serving of the item. Many items contain more than one serving per package.  Check the total grams (g) of carbs in one serving. You can calculate the number of servings of carbs in one serving by dividing the total carbs by 15. For example, if a food has 30 g of total carbs per serving, it would be equal to 2 servings of carbs.  Check the number of grams (g) of saturated fats and trans fats in one serving. Choose foods that have a low amount or none of these fats.  Check the number of milligrams (mg) of salt (sodium) in one serving. Most people should limit total sodium intake to less than 2,300 mg per day.  Always check the nutrition information of foods labeled as "low-fat" or "nonfat." These foods may be higher in added sugar or refined carbs and should be avoided.  Talk to your dietitian to identify your daily goals for nutrients listed on the label. Shopping  Avoid buying canned, pre-made, or processed foods. These foods tend to be high in fat, sodium, and added sugar.  Shop around the outside edge of the grocery store. This is where you will most often find fresh fruits and vegetables, bulk grains, fresh meats, and fresh dairy. Cooking  Use low-heat cooking methods, such as baking, instead of high-heat cooking methods like deep frying.  Cook using healthy oils, such as olive, canola, or sunflower oil.  Avoid cooking with butter, cream, or high-fat meats. Meal  planning  Eat meals and snacks regularly, preferably at the same times every day. Avoid going long periods of time without eating.  Eat foods that are high in fiber, such as fresh fruits, vegetables, beans, and whole grains. Talk with your dietitian about how many servings of carbs you can eat at each meal.  Eat 4-6 oz (112-168 g) of lean protein each day, such as lean meat, chicken, fish, eggs, or tofu. One ounce (oz) of lean protein is equal to: ? 1 oz (28 g) of meat, chicken, or fish. ? 1 egg. ?  cup (62 g) of tofu.  Eat some foods each day that contain healthy fats, such as avocado, nuts, seeds, and fish.   What foods should I eat? Fruits Berries. Apples. Oranges. Peaches. Apricots. Plums. Grapes. Mango. Papaya. Pomegranate. Kiwi. Cherries. Vegetables Lettuce. Spinach. Leafy greens, including kale, chard, collard greens, and mustard greens. Beets. Cauliflower. Cabbage. Broccoli. Carrots. Green beans.  Tomatoes. Peppers. Onions. Cucumbers. Brussels sprouts. Grains Whole grains, such as whole-wheat or whole-grain bread, crackers, tortillas, cereal, and pasta. Unsweetened oatmeal. Quinoa. Brown or wild rice. Meats and other proteins Seafood. Poultry without skin. Lean cuts of poultry and beef. Tofu. Nuts. Seeds. Dairy Low-fat or fat-free dairy products such as milk, yogurt, and cheese. The items listed above may not be a complete list of foods and beverages you can eat. Contact a dietitian for more information. What foods should I avoid? Fruits Fruits canned with syrup. Vegetables Canned vegetables. Frozen vegetables with butter or cream sauce. Grains Refined white flour and flour products such as bread, pasta, snack foods, and cereals. Avoid all processed foods. Meats and other proteins Fatty cuts of meat. Poultry with skin. Breaded or fried meats. Processed meat. Avoid saturated fats. Dairy Full-fat yogurt, cheese, or milk. Beverages Sweetened drinks, such as soda or iced  tea. The items listed above may not be a complete list of foods and beverages you should avoid. Contact a dietitian for more information. Questions to ask a health care provider  Do I need to meet with a diabetes educator?  Do I need to meet with a dietitian?  What number can I call if I have questions?  When are the best times to check my blood glucose? Where to find more information:  American Diabetes Association: diabetes.org  Academy of Nutrition and Dietetics: www.eatright.[employer] of Diabetes and Digestive and Kidney Diseases: CarFlippers.tn  Association of Diabetes Care and Education Specialists: www.diabeteseducator.org Summary  It is important to have healthy eating habits because your blood sugar (glucose) levels are greatly affected by what you eat and drink.  A healthy meal plan will help you control your blood glucose and maintain a healthy lifestyle.  Your health care provider may recommend that you work with a dietitian to make a meal plan that is best for you.  Keep in mind that carbohydrates (carbs) and alcohol have immediate effects on your blood glucose levels. It is important to count carbs and to use alcohol carefully. This information is not intended to replace advice given to you by your health care provider. Make sure you discuss any questions you have with your health care provider. Document Revised: 11/01/2019 Document Reviewed: 11/01/2019 Elsevier Patient Education  2021 [employer].

## 2020-12-26 NOTE — Progress Notes (Signed)
Subjective:    Patient ID: Maurice Rogers, male    DOB: 15-Feb-1971, 50 y.o.   MRN: [id number]  Maurice Rogers is a 50 y.o. male presenting on 12/26/2020 for Annual Exam   HPI   Here for Annual Physical and Lab Review.  CHRONIC HTN Previously tried Amlodipine had poor results on this, ineffective. Switched to Losartan 50mg  in 05/2019, see prior notes - Interval update withhome BP readings improved He remains off medication Meds no longer on Losartan 50mg  Lifestyle - Weight unchanged, unable to lose - Diet - stable without change - Exercise - limited due to not able to go to gym Denies CP, dyspnea, HA, edema, dizziness / lightheadedness  Elevated TSH Last lab 11/2020 normal. Previous mild elevated TSH. No prior abnormality. Never treated.   Elevated A1c / Morbid Obesity BMI >45 Prior A1c in 12/2018 borderline elevated at 5.8, he had improved to A1c 5.6 previously, then now back up to 5.8, admits some limited diet interventions. Now has last lab A1c at 6.7 at risk of diabetes He admits poor diet and sedentary Weight gain Meds:none Denies hypoglycemia, polyuria, visual changes, numbness or tingling.  Additional complaint  Possibe OSA He has night time awakening, unsure if related to sleep apnea or breathing, he wakes up to urinate as well has nocturia.  Health Maintenance:  Colon CA Screening: Never had colonoscopy. UTD Cologuard negative 01/2019.Currently asymptomatic. No known family history of colon CA.   Prostate CA Screening: PSA 0.4 negative (11/2019) Currently asymptomaticexcept occasional nocturia, no dx ofBPH LUTS. No known family history of prostate CA.  UTD Flu Vaccine 09/2020  UTD COVID vaccine moderna x 2 doses. Has not yet received booster dose. 2nd dose was 04/2020   [facility] 2/9 12/26/2020 12/13/2019 06/06/2019  Decreased Interest 0 0 0  Down, Depressed, Hopeless 0 0 0  PHQ - 2 Score 0 0 0  Altered sleeping - 0 -  Tired, decreased  energy - 0 -  Change in appetite - 0 -  Feeling bad or failure about yourself  - 0 -  Trouble concentrating - 0 -  Moving slowly or fidgety/restless - 0 -  Suicidal thoughts - 0 -  PHQ-9 Score - 0 -  Difficult doing work/chores - Not difficult at all -    Past Medical History:  Diagnosis Date  . Obesity    Past Surgical History:  Procedure Laterality Date  . CARPAL TUNNEL RELEASE Right 02/18/2018   Procedure: CARPAL TUNNEL RELEASE;  Surgeon: Kennedy BuckerMenz, Michael, MD;  Location: [facility] ORS;  Service: Orthopedics;  Laterality: Right;  . FOOT SURGERY Right 1997  . HERNIA REPAIR Bilateral 1992  . NASAL SINUS SURGERY    . TRACHEOSTOMY     @ 18 MONTHS OLD DUE TO CROUP   Social History   Socioeconomic History  . Marital status: Divorced    Spouse name: Not on file  . Number of children: Not on file  . Years of education: Not on file  . Highest education level: Not on file  Occupational History  . Not on file  Tobacco Use  . Smoking status: Former Smoker    Packs/day: 1.00    Years: 4.00    Pack years: 4.00    Types: Cigarettes    Quit date: 12/08/2000    Years since quitting: 20.0  . Smokeless tobacco: Former Clinical biochemistUser  Vaping Use  . Vaping Use: Never used  Substance and Sexual Activity  . Alcohol use: No  .  Drug use: No  . Sexual activity: Not Currently  Other Topics Concern  . Not on file  Social History Narrative  . Not on file   Social Determinants of Health   Financial Resource Strain: Not on file  Food Insecurity: Not on file  Transportation Needs: Not on file  Physical Activity: Not on file  Stress: Not on file  Social Connections: Not on file  Intimate Partner Violence: Not on file   Family History  Problem Relation Age of Onset  . Diabetes Mother   . Mental illness Mother        bipolar, depression  . Seizures Mother   . Stroke Mother   . Diabetes Sister   . Diabetes Maternal Grandfather   . CAD Paternal Grandfather   . Hyperlipidemia Paternal Grandfather    . Hypertension Paternal Grandfather   . Heart disease Paternal Grandfather   . Cancer Paternal Grandfather   . Heart disease Father   . Dementia Maternal Grandmother    No current outpatient medications on file prior to visit.   No current facility-administered medications on file prior to visit.    Review of Systems  Constitutional: Negative for activity change, appetite change, chills, diaphoresis, fatigue and fever.  HENT: Negative for congestion and hearing loss.   Eyes: Negative for visual disturbance.  Respiratory: Negative for cough, chest tightness, shortness of breath and wheezing.   Cardiovascular: Negative for chest pain, palpitations and leg swelling.  Gastrointestinal: Negative for abdominal pain, constipation, diarrhea, nausea and vomiting.  Genitourinary: Negative for dysuria, frequency and hematuria.  Musculoskeletal: Negative for arthralgias and neck pain.  Skin: Negative for rash.  Neurological: Negative for dizziness, weakness, light-headedness, numbness and headaches.  Hematological: Negative for adenopathy.  Psychiatric/Behavioral: Negative for behavioral problems, dysphoric mood and sleep disturbance.   Per HPI unless specifically indicated above      Objective:    BP 132/81   Pulse 66   Ht 5\' 10"  (1.778 m)   Wt (!) 330 lb (149.7 kg)   SpO2 99%   BMI 47.35 kg/m   Wt Readings from Last 3 Encounters:  12/26/20 (!) 330 lb (149.7 kg)  12/13/19 (!) 320 lb (145.2 kg)  12/06/18 (!) 320 lb (145.2 kg)    Physical Exam Vitals and nursing note reviewed.  Constitutional:      General: He is not in acute distress.    Appearance: He is well-developed and well-nourished. He is obese. He is not diaphoretic.     Comments: Well-appearing, comfortable, cooperative  HENT:     Head: Normocephalic and atraumatic.     Mouth/Throat:     Mouth: Oropharynx is clear and moist.  Eyes:     General:        Right eye: No discharge.        Left eye: No discharge.      Conjunctiva/sclera: Conjunctivae normal.  Neck:     Thyroid: No thyromegaly.     Vascular: No carotid bruit.  Cardiovascular:     Rate and Rhythm: Normal rate and regular rhythm.     Pulses: Intact distal pulses.     Heart sounds: Normal heart sounds. No murmur heard.   Pulmonary:     Effort: Pulmonary effort is normal. No respiratory distress.     Breath sounds: Normal breath sounds. No wheezing or rales.  Musculoskeletal:        General: No edema. Normal range of motion.     Cervical back: Normal range of motion and  neck supple.     Right lower leg: No edema.     Left lower leg: No edema.  Lymphadenopathy:     Cervical: No cervical adenopathy.  Skin:    General: Skin is warm and dry.     Findings: No erythema or rash.  Neurological:     Mental Status: He is alert and oriented to person, place, and time.  Psychiatric:        Mood and Affect: Mood and affect normal.        Behavior: Behavior normal.     Comments: Well groomed, good eye contact, normal speech and thoughts       Results for orders placed or performed in visit on 12/04/20  CBC with Differential/Platelet  Result Value Ref Range   WBC 6.0 3.8 - 10.8 Thousand/uL   RBC 5.41 4.20 - 5.80 Million/uL   Hemoglobin 15.3 13.2 - 17.1 g/dL   HCT 16.1 09.6 - 04.5 %   MCV 84.8 80.0 - 100.0 fL   MCH 28.3 27.0 - 33.0 pg   MCHC 33.3 32.0 - 36.0 g/dL   RDW 40.9 81.1 - 91.4 %   Platelets 179 140 - 400 Thousand/uL   MPV 11.0 7.5 - 12.5 fL   Neutro Abs 3,750 1,500 - 7,800 cells/uL   Lymphs Abs 1,650 850 - 3,900 cells/uL   Absolute Monocytes 378 200 - 950 cells/uL   Eosinophils Absolute 192 15 - 500 cells/uL   Basophils Absolute 30 0 - 200 cells/uL   Neutrophils Relative % 62.5 %   Total Lymphocyte 27.5 %   Monocytes Relative 6.3 %   Eosinophils Relative 3.2 %   Basophils Relative 0.5 %  Hemoglobin A1c  Result Value Ref Range   Hgb A1c MFr Bld 6.7 (H) <5.7 % of total Hgb   Mean Plasma Glucose 146 mg/dL   eAG  (mmol/L) 8.1 mmol/L  Lipid panel  Result Value Ref Range   Cholesterol 148 <200 mg/dL   HDL 29 (L) > OR = 40 mg/dL   Triglycerides 782 (H) <150 mg/dL   LDL Cholesterol (Calc) 72 mg/dL (calc)   Total CHOL/HDL Ratio 5.1 (H) <5.0 (calc)   Non-HDL Cholesterol (Calc) 119 <130 mg/dL (calc)  PSA  Result Value Ref Range   PSA 0.41 < OR = 4.0 ng/mL  COMPLETE METABOLIC PANEL WITH GFR  Result Value Ref Range   Glucose, Bld 259 (H) 65 - 99 mg/dL   BUN 10 7 - 25 mg/dL   Creat 9.56 2.13 - 0.86 mg/dL   GFR, Est Non African American 96 > OR = 60 mL/min/1.15m2   GFR, Est African American 111 > OR = 60 mL/min/1.79m2   BUN/Creatinine Ratio NOT APPLICABLE 6 - 22 (calc)   Sodium 135 135 - 146 mmol/L   Potassium 4.3 3.5 - 5.3 mmol/L   Chloride 101 98 - 110 mmol/L   CO2 27 20 - 32 mmol/L   Calcium 8.6 8.6 - 10.3 mg/dL   Total Protein 5.7 (L) 6.1 - 8.1 g/dL   Albumin 3.8 3.6 - 5.1 g/dL   Globulin 1.9 1.9 - 3.7 g/dL (calc)   AG Ratio 2.0 1.0 - 2.5 (calc)   Total Bilirubin 0.6 0.2 - 1.2 mg/dL   Alkaline phosphatase (APISO) 76 35 - 144 U/L   AST 15 10 - 35 U/L   ALT 19 9 - 46 U/L  TSH + free T4  Result Value Ref Range   TSH W/REFLEX TO FT4 4.44 0.40 - 4.50  mIU/L      Assessment & Plan:   Problem List Items Addressed This Visit    Morbid obesity with BMI of 45.0-49.9, adult (HCC)   Hyperlipidemia    Controlled LDL Elevated total and TG with hyperglycemia Last lipid panel 11/2020 The 10-year ASCVD risk score Denman George DC Jr., et al., 2013) is: 4.7%  Plan: 1. No medication 2. Encourage improved lifestyle - low carb/cholesterol, reduce portion size, continue improving regular exercise      Essential hypertension    Controlled BP off meds No known complications  Off Amlodipine, Losartan  Plan:  1. Remain off med for now 2. Encourage improved lifestyle - low sodium diet, regular exercise 3. Keep monitor BP outside office, bring readings to next visit      Elevated hemoglobin A1c    Other  Visit Diagnoses    Annual physical exam    -  Primary      Updated Health Maintenance information Due COVID Booster Cologuard or screening in 01/2022 Reviewed recent lab results with patient, note labs were not fasting. Encouraged improvement to lifestyle with diet and exercise - Goal of weight loss  Handout given on Diabetic Diet, goals for reducing A1c, discussed that if elevated A1c >6.5 repeat test in 4 months would be considered Type 2 Diabetes.    No orders of the defined types were placed in this encounter.     Follow up plan: Return in about 4 months (around 04/25/2021) for 4 month PreDM A1c.  Saralyn Pilar, DO [facility] 12/26/2020, 4:19 PM

## 2020-12-26 NOTE — Assessment & Plan Note (Signed)
Controlled LDL Elevated total and TG with hyperglycemia Last lipid panel 11/2020 The 10-year ASCVD risk score Denman George DC Jr., et al., 2013) is: 4.7%  Plan: 1. No medication 2. Encourage improved lifestyle - low carb/cholesterol, reduce portion size, continue improving regular exercise

## 2020-12-26 NOTE — Assessment & Plan Note (Signed)
Controlled BP off meds No known complications  Off Amlodipine, Losartan  Plan:  1. Remain off med for now 2. Encourage improved lifestyle - low sodium diet, regular exercise 3. Keep monitor BP outside office, bring readings to next visit

## 2020-12-28 ENCOUNTER — Encounter: Payer: BC Managed Care – PPO | Admitting: Family Medicine

## 2021-02-11 ENCOUNTER — Telehealth: Payer: Self-pay

## 2021-02-11 NOTE — Telephone Encounter (Signed)
Copied from CRM [id number]. Topic: General - Other >> Feb 11, 2021 10:32 AM Gaetana Michaelis A wrote: Reason for CRM: Patient would like to be contacted regarding a diagnostic performed on 12/04/20  Patient has been notified by their insurance that procedure will not be covered due to it being incorrectly coded  Patient would like to be contacted by a member of staff to discuss further

## 2021-02-11 NOTE — Telephone Encounter (Signed)
Attempted to call patient but there was no answer and the voicemail box is full. Will attempt to call back later

## 2021-05-01 ENCOUNTER — Ambulatory Visit: Payer: Self-pay | Admitting: Family Medicine

## 2021-05-10 ENCOUNTER — Other Ambulatory Visit: Payer: Self-pay

## 2021-05-10 ENCOUNTER — Other Ambulatory Visit: Payer: Self-pay | Admitting: Family Medicine

## 2021-05-10 ENCOUNTER — Ambulatory Visit: Payer: BC Managed Care – PPO | Admitting: Family Medicine

## 2021-05-10 VITALS — BP 155/95 | HR 60 | Ht 70.0 in | Wt 329.4 lb

## 2021-05-10 DIAGNOSIS — J3089 Other allergic rhinitis: Secondary | ICD-10-CM

## 2021-05-10 DIAGNOSIS — R7309 Other abnormal glucose: Secondary | ICD-10-CM

## 2021-05-10 DIAGNOSIS — J351 Hypertrophy of tonsils: Secondary | ICD-10-CM | POA: Diagnosis not present

## 2021-05-10 DIAGNOSIS — K219 Gastro-esophageal reflux disease without esophagitis: Secondary | ICD-10-CM | POA: Diagnosis not present

## 2021-05-10 MED ORDER — FLUTICASONE PROPIONATE 50 MCG/ACT NA SUSP: 2.0000 | Freq: Every day | NASAL | 3 refills | Status: DC

## 2021-05-10 NOTE — Progress Notes (Signed)
Subjective:    Patient ID: Maurice Rogers, male    DOB: [birth year], 50 y.o.   MRN: [id number]  Maurice Rogers is a 50 y.o. male presenting on 05/10/2021 for tonsil swelling, swallowing problem   HPI  Swallowing/Throat Problem Allergic Rhinosinusitis  Reports a throat discomfort vs "annoyance" when swallows, onset since 3 days ago. Mostly notices when eating he feels there is some tightness or issue with throat that takes more effort to swallow. Occasionally he can get a random pain in the throat, like a brief pinch for seconds only. If he is not swallowing feels like there is a tightness still in throat. Concern for left tonsillar swelling. Not actually having choking or dysphagia, not affecting liquids. Worse with tougher and harder to swallow or chew foods. Soft foods are no problem. - Admits allergy symptoms with drainage and sinuses in back of throat, not taking OTC allergy medicine. Denies fever chills cough dyspnea Denies heartburn, nausea vomiting diarrhea  Additionally due to return next week for labs for A1c repeat.   Depression screen [facility] 2/9 05/10/2021 12/26/2020 12/13/2019  Decreased Interest 0 0 0  Down, Depressed, Hopeless 0 0 0  PHQ - 2 Score 0 0 0  Altered sleeping 1 - 0  Tired, decreased energy 1 - 0  Change in appetite 1 - 0  Feeling bad or failure about yourself  0 - 0  Trouble concentrating 0 - 0  Moving slowly or fidgety/restless 0 - 0  Suicidal thoughts 0 - 0  PHQ-9 Score 3 - 0  Difficult doing work/chores Not difficult at all - Not difficult at all    Social History   Tobacco Use  . Smoking status: Former Smoker    Packs/day: 1.00    Years: 4.00    Pack years: 4.00    Types: Cigarettes    Quit date: 12/08/2000    Years since quitting: 20.4  . Smokeless tobacco: Former Clinical biochemist  . Vaping Use: Never used  Substance Use Topics  . Alcohol use: No  . Drug use: No    Review of Systems Per HPI unless specifically indicated above      Objective:    BP (!) 155/95   Pulse 60   Ht 5\' 10"  (1.778 m)   Wt (!) 329 lb 6.4 oz (149.4 kg)   SpO2 98%   BMI 47.26 kg/m   Wt Readings from Last 3 Encounters:  05/10/21 (!) 329 lb 6.4 oz (149.4 kg)  12/26/20 (!) 330 lb (149.7 kg)  12/13/19 (!) 320 lb (145.2 kg)    Physical Exam Vitals and nursing note reviewed.  Constitutional:      General: He is not in acute distress.    Appearance: He is well-developed. He is not diaphoretic.     Comments: Well-appearing, comfortable, cooperative  HENT:     Head: Normocephalic and atraumatic.     Nose: No congestion.     Mouth/Throat:     Mouth: Mucous membranes are moist.     Pharynx: No oropharyngeal exudate or posterior oropharyngeal erythema.     Comments: Posterior throat with some post nasal drainage phlegm. No erythema no exudates. Tonsils appear mildly enlarged L>R but no sign of tonsillitis or other acute infection. Eyes:     General:        Right eye: No discharge.        Left eye: No discharge.     Conjunctiva/sclera: Conjunctivae normal.  Cardiovascular:  Rate and Rhythm: Normal rate.  Pulmonary:     Effort: Pulmonary effort is normal.  Skin:    General: Skin is warm and dry.     Findings: No erythema or rash.  Neurological:     Mental Status: He is alert and oriented to person, place, and time.  Psychiatric:        Behavior: Behavior normal.     Comments: Well groomed, good eye contact, normal speech and thoughts    Results for orders placed or performed in visit on 12/04/20  CBC with Differential/Platelet  Result Value Ref Range   WBC 6.0 3.8 - 10.8 Thousand/uL   RBC 5.41 4.20 - 5.80 Million/uL   Hemoglobin 15.3 13.2 - 17.1 g/dL   HCT 32.9 92.4 - 26.8 %   MCV 84.8 80.0 - 100.0 fL   MCH 28.3 27.0 - 33.0 pg   MCHC 33.3 32.0 - 36.0 g/dL   RDW 34.1 96.2 - 22.9 %   Platelets 179 140 - 400 Thousand/uL   MPV 11.0 7.5 - 12.5 fL   Neutro Abs 3,750 1,500 - 7,800 cells/uL   Lymphs Abs 1,650 850 - 3,900 cells/uL    Absolute Monocytes 378 200 - 950 cells/uL   Eosinophils Absolute 192 15 - 500 cells/uL   Basophils Absolute 30 0 - 200 cells/uL   Neutrophils Relative % 62.5 %   Total Lymphocyte 27.5 %   Monocytes Relative 6.3 %   Eosinophils Relative 3.2 %   Basophils Relative 0.5 %  Hemoglobin A1c  Result Value Ref Range   Hgb A1c MFr Bld 6.7 (H) <5.7 % of total Hgb   Mean Plasma Glucose 146 mg/dL   eAG (mmol/L) 8.1 mmol/L  Lipid panel  Result Value Ref Range   Cholesterol 148 <200 mg/dL   HDL 29 (L) > OR = 40 mg/dL   Triglycerides 798 (H) <150 mg/dL   LDL Cholesterol (Calc) 72 mg/dL (calc)   Total CHOL/HDL Ratio 5.1 (H) <5.0 (calc)   Non-HDL Cholesterol (Calc) 119 <130 mg/dL (calc)  PSA  Result Value Ref Range   PSA 0.41 < OR = 4.0 ng/mL  COMPLETE METABOLIC PANEL WITH GFR  Result Value Ref Range   Glucose, Bld 259 (H) 65 - 99 mg/dL   BUN 10 7 - 25 mg/dL   Creat 9.21 1.94 - 1.74 mg/dL   GFR, Est Non African American 96 > OR = 60 mL/min/1.89m2   GFR, Est African American 111 > OR = 60 mL/min/1.93m2   BUN/Creatinine Ratio NOT APPLICABLE 6 - 22 (calc)   Sodium 135 135 - 146 mmol/L   Potassium 4.3 3.5 - 5.3 mmol/L   Chloride 101 98 - 110 mmol/L   CO2 27 20 - 32 mmol/L   Calcium 8.6 8.6 - 10.3 mg/dL   Total Protein 5.7 (L) 6.1 - 8.1 g/dL   Albumin 3.8 3.6 - 5.1 g/dL   Globulin 1.9 1.9 - 3.7 g/dL (calc)   AG Ratio 2.0 1.0 - 2.5 (calc)   Total Bilirubin 0.6 0.2 - 1.2 mg/dL   Alkaline phosphatase (APISO) 76 35 - 144 U/L   AST 15 10 - 35 U/L   ALT 19 9 - 46 U/L  TSH + free T4  Result Value Ref Range   TSH W/REFLEX TO FT4 4.44 0.40 - 4.50 mIU/L      Assessment & Plan:   Problem List Items Addressed This Visit   None   Visit Diagnoses    Seasonal allergic rhinitis due  to other allergic trigger    -  Primary   Relevant Medications   fluticasone (FLONASE) 50 MCG/ACT nasal spray   Swelling of tonsil       Laryngopharyngeal reflux (LPR)          Clinically with throat vs  tonsillar problem, impacting swallowing with solid foods Seems to have mostly rhinosinusitis/allergy related symptoms causing post nasal drainage right now No sign of acute infection (sinusitis, or pharyngitis, or tonsillitis) - reassuring.  No other constitutional symptoms or concerns right now. No liquid or soft food dysphagia. Or regurgitation or choking. No other GI symptoms. Without nausea vomiting  Cannot rule out Silent GERD reflux vs LPR. Not endorsing heartburn.  Start nasal steroid Flonase 2 sprays in each nostril daily for 4-6 weeks, may repeat course seasonally or as needed  Add OTC anti histamine as well.  Next will recommend OTC trial PPI course as per AVS for silent reflux, or we can rx if indicated 20 or 40mg  dose next option.  Follow-up if unresolved, last options include referral ENT vs GI based on concerns, if tonsillar vs dysphagia.  Meds ordered this encounter  Medications  . fluticasone (FLONASE) 50 MCG/ACT nasal spray    Sig: Place 2 sprays into both nostrils daily. Use for 4-6 weeks then stop and use seasonally or as needed.    Dispense:  16 g    Refill:  3      Follow up plan: Return in about 1 week (around 05/17/2021) for re-schedule 6/8 instead of office visit change it to fasting lab only in AM.  Future labs ordered for A1c next week   8/8, DO [facility] 05/10/2021, 2:47 PM

## 2021-05-10 NOTE — Patient Instructions (Addendum)
Thank you for coming to the office today.  Most likely sinus / allergic cause with drainage, as seen in back of throat today.  Start nasal steroid Flonase 2 sprays in each nostril daily for 4-6 weeks, may repeat course seasonally or as needed  Also add oral anti histamine allergy med such as Claritin, zyrtec, allegra - regular one generic or brand name is fine, no other additives.  - Claritin (Loratadine) is my preferred, non drowsy, very effective - Zyrtec (Cetirizine) can be slightly more sedating but works well - Allegra (Fexofenadine) technically is the strongest of them, but may be higher cost  ----------------------------------------  Your symptoms sound most consistent with Silent Reflux or (Laryngopharyngeal Reflux), this is similar to Acid Reflux (or GERD) but usually involves the Throat and has a variety of symptoms including a "globus sensation", or air bubble or pressure in throat. Commonly occurs in patients who have had some symptoms of traditional heartburn before, but this can occur even when not eating spicy foods.  If the allergy treatment is not working. We can try to treat stomach acid.  Start the PPI medicine over the counter either - Omeprazole 20mg  daily or Nexium 20mg  daily - Take one capsule 30 min before first meal of day, same time every day for at least 2 weeks, max treatment up to 4 weeks then stop. If it resolves but then comes back again, you can repeat the 2-4 week course again as needed. - Avoid spicy, greasy, fried foods, also things like caffeine, dark chocolate, peppermint can worsen - Avoid large meals and late night snacks, also do not go more than 4-5 hours without a snack or meal (not eating will worsen reflux symptoms due to stomach acid)  If the problem improves but keeps coming back, we can discuss higher dose or longer course at next visit.  If symptoms are worsening, persistent symptoms despite treatment or develop esophageal or abdominal pain,  unable to swallow solids or liquids, nausea, vomiting, fever/chills, or unintentional weight loss / no appetite, please follow-up sooner or seek more immediate medical attention.   Please schedule a Follow-up Appointment to: Return in about 1 week (around 05/17/2021) for re-schedule 6/8 instead of office visit change it to fasting lab only in AM.  If you have any other questions or concerns, please feel free to call the office or send a message through MyChart. You may also schedule an earlier appointment if necessary.  Additionally, you may be receiving a survey about your experience at our office within a few days to 1 week by e-mail or mail. We value your feedback.  07/17/2021, DO [facility], Saralyn Pilar

## 2021-05-14 ENCOUNTER — Other Ambulatory Visit: Payer: BC Managed Care – PPO

## 2021-05-14 ENCOUNTER — Other Ambulatory Visit: Payer: Self-pay

## 2021-05-14 DIAGNOSIS — R7309 Other abnormal glucose: Secondary | ICD-10-CM

## 2021-05-15 ENCOUNTER — Ambulatory Visit: Payer: Self-pay | Admitting: Family Medicine

## 2021-05-15 LAB — HEMOGLOBIN A1C
Hgb A1c MFr Bld: 6.6 % of total Hgb — ABNORMAL HIGH (ref <5.7)
Mean Plasma Glucose: 143 mg/dL
eAG (mmol/L): 7.9 mmol/L

## 2021-08-07 ENCOUNTER — Other Ambulatory Visit: Payer: Self-pay | Admitting: Family Medicine

## 2021-08-07 DIAGNOSIS — J3089 Other allergic rhinitis: Secondary | ICD-10-CM

## 2021-08-07 NOTE — Telephone Encounter (Signed)
Requested Prescriptions  Pending Prescriptions Disp Refills  . fluticasone (FLONASE) 50 MCG/ACT nasal spray [Pharmacy Med Name: FLUTICASONE PROP 50 MCG SPRAY] 48 mL 1    Sig: PLACE 2 SPRAYS INTO BOTH NOSTRILS DAILY FOR 4-6 WEEKS THEN STOP AND USE SEASONALLY OR AS NEEDED.     Ear, Nose, and Throat: Nasal Preparations - Corticosteroids Passed - 08/07/2021  1:25 AM      Passed - Valid encounter within last 12 months    Recent Outpatient Visits          2 months ago Seasonal allergic rhinitis due to other allergic trigger   [facility] Smitty Cords, DO   7 months ago Annual physical exam   [facility] Smitty Cords, DO   1 year ago Annual physical exam   [facility] Smitty Cords, DO   2 years ago Essential hypertension   [facility] Smitty Cords, DO   2 years ago Annual physical exam   [facility] Smitty Cords, DO

## 2023-12-11 ENCOUNTER — Ambulatory Visit (INDEPENDENT_AMBULATORY_CARE_PROVIDER_SITE_OTHER): Payer: BC Managed Care – PPO | Admitting: Family Medicine

## 2023-12-11 ENCOUNTER — Encounter: Payer: Self-pay | Admitting: Family Medicine

## 2023-12-11 VITALS — BP 132/80 | HR 71 | Ht 70.0 in | Wt 307.0 lb

## 2023-12-11 DIAGNOSIS — Z0000 Encounter for general adult medical examination without abnormal findings: Secondary | ICD-10-CM

## 2023-12-11 DIAGNOSIS — R7989 Other specified abnormal findings of blood chemistry: Secondary | ICD-10-CM

## 2023-12-11 DIAGNOSIS — R7309 Other abnormal glucose: Secondary | ICD-10-CM

## 2023-12-11 DIAGNOSIS — I10 Essential (primary) hypertension: Secondary | ICD-10-CM

## 2023-12-11 DIAGNOSIS — L720 Epidermal cyst: Secondary | ICD-10-CM

## 2023-12-11 DIAGNOSIS — E782 Mixed hyperlipidemia: Secondary | ICD-10-CM

## 2023-12-11 DIAGNOSIS — Z8249 Family history of ischemic heart disease and other diseases of the circulatory system: Secondary | ICD-10-CM

## 2023-12-11 DIAGNOSIS — Z1211 Encounter for screening for malignant neoplasm of colon: Secondary | ICD-10-CM | POA: Diagnosis not present

## 2023-12-11 DIAGNOSIS — Z6841 Body Mass Index (BMI) 40.0 and over, adult: Secondary | ICD-10-CM

## 2023-12-11 DIAGNOSIS — Z23 Encounter for immunization: Secondary | ICD-10-CM

## 2023-12-11 DIAGNOSIS — L409 Psoriasis, unspecified: Secondary | ICD-10-CM

## 2023-12-11 DIAGNOSIS — Z125 Encounter for screening for malignant neoplasm of prostate: Secondary | ICD-10-CM

## 2023-12-11 NOTE — Patient Instructions (Addendum)
 Thank you for coming to the office today.  Referral to Dermatology today for eval of the skin spot / cyst lesion and the psoriasis  ------------------------------  Consider [facility] for possible Home Sleep Study eval for potential sleep apnea.  -------------  Tdap tetanus vaccine today  Ordered the Cologuard (home kit) test for colon cancer screening. Stay tuned for further updates.  It will be shipped to you directly. If not received in 2-4 weeks, call us  or the company.   If you send it back and no results are received in 2-4 weeks, call us  or the company as well!   Colon Cancer Screening: - For all adults age 73+ routine colon cancer screening is highly recommended.     - Recent guidelines from American [employer] Society recommend starting age of 62 - Early detection of colon cancer is important, because often there are no warning signs or symptoms, also if found early usually it can be cured. Late stage is hard to treat.   - If Cologuard is NEGATIVE, then it is good for 3 years before next due - If Cologuard is POSITIVE, then it is strongly advised to get a Colonoscopy, which allows the GI doctor to locate the source of the cancer or polyp (even very early stage) and treat it by removing it. ------------------------- Follow instructions to collect sample, you may call the company for any help or questions, 24/7 telephone support at [phone].  DUE for FASTING BLOOD WORK (no food or drink after midnight before the lab appointment, only water or coffee without cream/sugar on the morning of)  SCHEDULE Lab Only visit in the morning at the clinic for lab draw on 12/17/23 815am  - Make sure Lab Only appointment is at about 1 week before your next appointment, so that results will be available  For Lab Results, once available within 2-3 days of blood draw, you can can log in to MyChart online to view your results and a brief explanation. Also, we can discuss results at next  follow-up visit.   Please schedule a Follow-up Appointment to: Return in about 1 year (around 12/10/2024) for 1 year fasting lab > 1 week later Annual Physical.  If you have any other questions or concerns, please feel free to call the office or send a message through MyChart. You may also schedule an earlier appointment if necessary.  Additionally, you may be receiving a survey about your experience at our office within a few days to 1 week by e-mail or mail. We value your feedback.  Marsa Officer, DO [facility] [place], [place]

## 2023-12-11 NOTE — Progress Notes (Signed)
 Subjective:    Patient ID: Maurice Rogers, male    DOB: December 09, 1970, 53 y.o.   MRN: [id number]  Maurice Rogers is a 53 y.o. male presenting on 12/11/2023 for Annual Exam   HPI  Discussed the use of AI scribe software for clinical note transcription with the patient, who gave verbal consent to proceed.  History of Present Illness     The patient has been experiencing weight fluctuations, which he attributes to changes in his job and exercise routine. He reported a significant weight loss when regularly attending the gym, but has since gained weight after discontinuing this routine. The patient expressed a desire to reintegrate gym visits into his schedule, identifying exercise as a key factor in his weight management.      CHRONIC HTN Previously tried Amlodipine  had poor results on this, ineffective. Switched to Losartan  50mg  in 05/2019, see prior notes - Interval update with home BP readings improved He remains off medication Meds no longer on Losartan  50mg  Lifestyle - Weight unchanged, unable to lose - Diet - stable without change - Exercise - limited due to not able to go to gym Denies CP, dyspnea, HA, edema, dizziness / lightheadedness   Elevated TSH Last lab 11/2020 normal. Previous mild elevated TSH. No prior abnormality. Never treated.    Elevated A1c / Morbid Obesity BMI >44 Prior A1c in 2022, with A1c 6.6 borderline risk of diabetes Improving diet and lifestyle with further goals to improve Fluctuated weight some loss Meds: none Denies hypoglycemia, polyuria, visual changes, numbness or tingling.   Possibe OSA Chronic snoring. History of some night time awakening  Requesting Testosterone  testing due to abnormal weight gain, and some emotional lability, admits some fatigue. He says change of job and question if this is related.  Skin lesion He reported a long-standing skin lesion on the neck, which has recently increased in size. The lesion, initially presenting as  a red dot, has developed a bump over the years.    Health Maintenance:   Colon CA Screening: Never had colonoscopy. UTD Cologuard negative 01/2019. Currently asymptomatic. No known family history of colon CA - re ordered Cologuard now after 5 years from previous one..    Prostate CA Screening: PSA 0.41 negative 2021. Currently asymptomatic except occasional nocturia, no dx of BPH LUTS. No known family history of prostate CA.  Decline Flu vaccine Request Tdap today      12/11/2023    1:38 PM 05/10/2021    2:28 PM 12/26/2020    3:48 PM  Depression screen PHQ 2/9  Decreased Interest 0 0 0  Down, Depressed, Hopeless 0 0 0  PHQ - 2 Score 0 0 0  Altered sleeping  1   Tired, decreased energy  1   Change in appetite  1   Feeling bad or failure about yourself   0   Trouble concentrating  0   Moving slowly or fidgety/restless  0   Suicidal thoughts  0   PHQ-9 Score  3   Difficult doing work/chores  Not difficult at all        12/11/2023    1:38 PM 05/10/2021    2:29 PM  GAD 7 : Generalized Anxiety Score  Nervous, Anxious, on Edge 0 0  Control/stop worrying 0 0  Worry too much - different things 0 0  Trouble relaxing 0 1  Restless 0 0  Easily annoyed or irritable 0 1  Afraid - awful might happen 0 1  Total GAD  7 Score 0 3  Anxiety Difficulty  Somewhat difficult     Past Medical History:  Diagnosis Date   Obesity    Past Surgical History:  Procedure Laterality Date   CARPAL TUNNEL RELEASE Right 02/18/2018   Procedure: CARPAL TUNNEL RELEASE;  Surgeon: Kathlynn Sharper, MD;  Location: [facility] ORS;  Service: Orthopedics;  Laterality: Right;   FOOT SURGERY Right 1997   HERNIA REPAIR Bilateral 1992   NASAL SINUS SURGERY     TRACHEOSTOMY     @ 18 MONTHS OLD DUE TO CROUP   Social History   Socioeconomic History   Marital status: Divorced    Spouse name: Not on file   Number of children: Not on file   Years of education: Not on file   Highest education level: Not on file   Occupational History   Not on file  Tobacco Use   Smoking status: Former    Current packs/day: 0.00    Average packs/day: 1 pack/day for 4.0 years (4.0 ttl pk-yrs)    Types: Cigarettes    Start date: 12/08/1996    Quit date: 12/08/2000    Years since quitting: 23.0   Smokeless tobacco: Former  Building Services Engineer status: Never Used  Substance and Sexual Activity   Alcohol use: No   Drug use: No   Sexual activity: Not Currently  Other Topics Concern   Not on file  Social History Narrative   Not on file   Social Drivers of Health   Financial Resource Strain: Not on file  Food Insecurity: Not on file  Transportation Needs: Not on file  Physical Activity: Not on file  Stress: Not on file  Social Connections: Not on file  Intimate Partner Violence: Not on file   Family History  Problem Relation Age of Onset   Diabetes Mother    Mental illness Mother        bipolar, depression   Seizures Mother    Stroke Mother    Diabetes Sister    Diabetes Maternal Grandfather    CAD Paternal Grandfather    Hyperlipidemia Paternal Grandfather    Hypertension Paternal Grandfather    Heart disease Paternal Grandfather    Cancer Paternal Grandfather    Heart disease Father    Dementia Maternal Grandmother    No current outpatient medications on file prior to visit.   No current facility-administered medications on file prior to visit.    Review of Systems Per HPI unless specifically indicated above     Objective:    BP 132/80   Pulse 71   Ht 5' 10 (1.778 m)   Wt (!) 307 lb (139.3 kg)   SpO2 98%   BMI 44.05 kg/m   Wt Readings from Last 3 Encounters:  12/11/23 (!) 307 lb (139.3 kg)  05/10/21 (!) 329 lb 6.4 oz (149.4 kg)  12/26/20 (!) 330 lb (149.7 kg)    Physical Exam Vitals and nursing note reviewed.  Constitutional:      General: He is not in acute distress.    Appearance: He is well-developed. He is obese. He is not diaphoretic.     Comments: Well-appearing,  comfortable, cooperative  HENT:     Head: Normocephalic and atraumatic.  Eyes:     General:        Right eye: No discharge.        Left eye: No discharge.     Conjunctiva/sclera: Conjunctivae normal.     Pupils: Pupils are equal, round,  and reactive to light.  Neck:     Thyroid: No thyromegaly.     Vascular: No carotid bruit.  Cardiovascular:     Rate and Rhythm: Normal rate and regular rhythm.     Pulses: Normal pulses.     Heart sounds: Normal heart sounds. No murmur heard. Pulmonary:     Effort: Pulmonary effort is normal. No respiratory distress.     Breath sounds: Normal breath sounds. No wheezing or rales.  Abdominal:     General: Bowel sounds are normal. There is no distension.     Palpations: Abdomen is soft. There is no mass.     Tenderness: There is no abdominal tenderness.  Musculoskeletal:        General: No tenderness. Normal range of motion.     Cervical back: Normal range of motion and neck supple.     Right lower leg: No edema.     Left lower leg: No edema.     Comments: Upper / Lower Extremities: - Normal muscle tone, strength bilateral upper extremities 5/5, lower extremities 5/5  Lymphadenopathy:     Cervical: No cervical adenopathy.  Skin:    General: Skin is warm and dry.     Findings: Lesion (skin cyst anterior neck/chest 1 x 1 cm nodular firm density.) and rash (mild psoriasis plaques extensor surfaces forearms) present. No erythema.  Neurological:     Mental Status: He is alert and oriented to person, place, and time.     Comments: Distal sensation intact to light touch all extremities  Psychiatric:        Mood and Affect: Mood normal.        Behavior: Behavior normal.        Thought Content: Thought content normal.     Comments: Well groomed, good eye contact, normal speech and thoughts    Left forearm    Right forearm    Anterior neck / chest - Cyst     Results for orders placed or performed in visit on 05/14/21  Hemoglobin A1c    Collection Time: 05/14/21  9:03 AM  Result Value Ref Range   Hgb A1c MFr Bld 6.6 (H) <5.7 % of total Hgb   Mean Plasma Glucose 143 mg/dL   eAG (mmol/L) 7.9 mmol/L      Assessment & Plan:   Problem List Items Addressed This Visit     Elevated hemoglobin A1c   Elevated TSH   Essential hypertension   Relevant Orders   CT CARDIAC SCORING (SELF PAY ONLY)   Family history of heart disease   Relevant Orders   CT CARDIAC SCORING (SELF PAY ONLY)   Hyperlipidemia   Relevant Orders   CT CARDIAC SCORING (SELF PAY ONLY)   Morbid obesity with BMI of 45.0-49.9, adult (HCC)   Psoriasis   Relevant Orders   Ambulatory referral to Dermatology   Other Visit Diagnoses       Annual physical exam    -  Primary     Need for diphtheria-tetanus-pertussis (Tdap) vaccine       Relevant Orders   Tdap vaccine greater than or equal to 7yo IM (Completed)     Screening for colon cancer       Relevant Orders   Cologuard     Epidermoid cyst       Relevant Orders   Ambulatory referral to Dermatology        Updated Health Maintenance information Reviewed recent lab results with patient Encouraged improvement to lifestyle  with diet and exercise Goal of weight loss  Pre-Diabetes A1c previously elevated Due for repeat now. Counseling on lifestyle management  Skin Lesion Longstanding skin lesion on the neck, recently noted to be increasing in size. No other concerning skin lesions reported. -Referral to Dermatology for evaluation and possible excision.  Psoriasis Chronic, stable psoriasis without significant symptoms. Only skin involvement. -Referral to Dermatology for evaluation and possible treatment options.  Weight Management Recent weight gain due to decreased gym attendance. Previously successful weight loss with regular exercise. -Encouraged to reintegrate gym attendance into routine.  Possible Hypogonadism Reports of emotional lability, weight gain, and possible fatigue. -Order  morning testosterone  level on 12/17/2023.  Colon Cancer Screening Last Cologuard test in 2020, patient agrees to repeat testing. -Order Cologuard test.  Cardiovascular Risk Family history of heart disease. -Order CT heart scan for cardiovascular screening.  General Health Maintenance -Administer tetanus vaccine today. -Order labs including A1C, lipid panel, kidney and liver function, PSA, CBC, and thyroid function on 12/17/2023. -Consider future sleep study for possible sleep apnea.       Orders Placed This Encounter  Procedures   CT CARDIAC SCORING (SELF PAY ONLY)    Standing Status:   Future    Expiration Date:   12/10/2024    Preferred imaging location?:   [facility]   Tdap vaccine greater than or equal to 7yo IM   Cologuard   Ambulatory referral to Dermatology    Referral Priority:   Routine    Referral Type:   Consultation    Referral Reason:   Specialty Services Required    Requested Specialty:   Dermatology    Number of Visits Requested:   1    No orders of the defined types were placed in this encounter.    Follow up plan: Return in about 1 year (around 12/10/2024) for 1 year fasting lab > 1 week later Annual Physical.  Thurs 12/17/23 Testosterone , A1c CMET, Lipid, PSA, CBC, TSH  Marsa Officer, DO [facility] 12/11/2023, 2:02 PM

## 2023-12-17 ENCOUNTER — Other Ambulatory Visit: Payer: Self-pay

## 2023-12-17 ENCOUNTER — Encounter: Payer: Self-pay | Admitting: Internal Medicine

## 2023-12-17 ENCOUNTER — Ambulatory Visit: Payer: BC Managed Care – PPO | Admitting: Internal Medicine

## 2023-12-17 VITALS — BP 154/84 | HR 72 | Resp 18 | Ht 70.0 in | Wt 306.8 lb

## 2023-12-17 DIAGNOSIS — Z125 Encounter for screening for malignant neoplasm of prostate: Secondary | ICD-10-CM | POA: Diagnosis not present

## 2023-12-17 DIAGNOSIS — R7989 Other specified abnormal findings of blood chemistry: Secondary | ICD-10-CM

## 2023-12-17 DIAGNOSIS — R7309 Other abnormal glucose: Secondary | ICD-10-CM

## 2023-12-17 DIAGNOSIS — E782 Mixed hyperlipidemia: Secondary | ICD-10-CM | POA: Diagnosis not present

## 2023-12-17 DIAGNOSIS — I10 Essential (primary) hypertension: Secondary | ICD-10-CM | POA: Diagnosis not present

## 2023-12-17 DIAGNOSIS — J36 Peritonsillar abscess: Secondary | ICD-10-CM

## 2023-12-17 DIAGNOSIS — Z0000 Encounter for general adult medical examination without abnormal findings: Secondary | ICD-10-CM

## 2023-12-17 DIAGNOSIS — J029 Acute pharyngitis, unspecified: Secondary | ICD-10-CM

## 2023-12-17 LAB — POCT RAPID STREP A (OFFICE): Rapid Strep A Screen: NEGATIVE

## 2023-12-17 MED ORDER — CEFTRIAXONE SODIUM 1 G IJ SOLR
1.0000 g | Freq: Once | INTRAMUSCULAR | Status: AC
  Administered 2023-12-17: 1 g via INTRAMUSCULAR

## 2023-12-17 MED ORDER — CLINDAMYCIN HCL 300 MG PO CAPS: 300.0000 mg | ORAL_CAPSULE | Freq: Three times a day (TID) | ORAL | 0 refills | Status: AC

## 2023-12-17 MED ORDER — METHYLPREDNISOLONE ACETATE 80 MG/ML IJ SUSP
80.0000 mg | Freq: Once | INTRAMUSCULAR | Status: AC
  Administered 2023-12-17: 80 mg via INTRAMUSCULAR

## 2023-12-17 NOTE — Progress Notes (Signed)
 Subjective:    Patient ID: Maurice Rogers, male    DOB: 02-01-1971, 53 y.o.   MRN: [id number]  HPI  Discussed the use of AI scribe software for clinical note transcription with the patient, who gave verbal consent to proceed.   The patient, with a history of diabetes, presents with discomfort in the left side of the throat and jaw, which started on Tuesday and worsened by the following night. The pain is described as being from the left ear down to the left side of the jaw, with a sensation of fluid in the ear when moved. However, there is no ear pain, drainage, nasal congestion, or toothache. The patient denies cough, shortness of breath, fever, chills, or body aches. The most distressing symptom is difficulty swallowing, which started the night before the consultation. The patient denies exposure to anyone with strep throat and has not taken any over-the-counter medications for the symptoms.       Review of Systems   Past Medical History:  Diagnosis Date   Obesity     No current outpatient medications on file.   No current facility-administered medications for this visit.    No Known Allergies  Family History  Problem Relation Age of Onset   Diabetes Mother    Mental illness Mother        bipolar, depression   Seizures Mother    Stroke Mother    Diabetes Sister    Diabetes Maternal Grandfather    CAD Paternal Grandfather    Hyperlipidemia Paternal Grandfather    Hypertension Paternal Grandfather    Heart disease Paternal Grandfather    Cancer Paternal Grandfather    Heart disease Father    Dementia Maternal Grandmother     Social History   Socioeconomic History   Marital status: Divorced    Spouse name: Not on file   Number of children: Not on file   Years of education: Not on file   Highest education level: Not on file  Occupational History   Not on file  Tobacco Use   Smoking status: Former    Current packs/day: 0.00    Average packs/day: 1 pack/day  for 4.0 years (4.0 ttl pk-yrs)    Types: Cigarettes    Start date: 12/08/1996    Quit date: 12/08/2000    Years since quitting: 23.0   Smokeless tobacco: Former  Building Services Engineer status: Never Used  Substance and Sexual Activity   Alcohol use: No   Drug use: No   Sexual activity: Not Currently  Other Topics Concern   Not on file  Social History Narrative   Not on file   Social Drivers of Health   Financial Resource Strain: Not on file  Food Insecurity: Not on file  Transportation Needs: Not on file  Physical Activity: Not on file  Stress: Not on file  Social Connections: Not on file  Intimate Partner Violence: Not on file     Constitutional: Denies fever, malaise, fatigue, headache or abrupt weight changes.  HEENT: Pt reports sore throat. Denies eye pain, eye redness, ear pain, ringing in the ears, wax buildup, runny nose, nasal congestion, bloody nose. Respiratory: Denies difficulty breathing, shortness of breath, cough or sputum production.   Cardiovascular: Denies chest pain, chest tightness, palpitations or swelling in the hands or feet.  Gastrointestinal: Denies abdominal pain, bloating, constipation, diarrhea or blood in the stool.   No other specific complaints in a complete review of systems (except as listed  in HPI above).      Objective:   Physical Exam  BP (!) 160/90 (BP Location: Right Arm, Cuff Size: Large)   Pulse 72   Resp 18   Ht 5' 10 (1.778 m)   Wt (!) 306 lb 12.8 oz (139.2 kg)   BMI 44.02 kg/m  Wt Readings from Last 3 Encounters:  12/17/23 (!) 306 lb 12.8 oz (139.2 kg)  12/11/23 (!) 307 lb (139.3 kg)  05/10/21 (!) 329 lb 6.4 oz (149.4 kg)    General: Appears his stated age, obese, in NAD. Skin: Warm, dry and intact. No rashes noted. HEENT: Head: normal shape and size;  Ears: Tm's gray and intact, normal light reflex; Throat/Mouth: Teeth present, mucosa pink and moist, no exudate, lesions or ulcerations noted.  He has what appears to be a 1  cm abscess in the left peritonsillar area.  Uvula tracks to the left slightly. Neck: No adenopathy noted. Cardiovascular: Normal rate and rhythm.  Pulmonary/Chest: Normal effort and positive vesicular breath sounds. No respiratory distress. No wheezes, rales or ronchi noted.  Neurological: Alert and oriented.   BMET    Component Value Date/Time   NA 135 12/04/2020 0824   NA 138 03/28/2013 1710   K 4.3 12/04/2020 0824   K 3.9 03/28/2013 1710   CL 101 12/04/2020 0824   CL 104 03/28/2013 1710   CO2 27 12/04/2020 0824   CO2 28 03/28/2013 1710   GLUCOSE 259 (H) 12/04/2020 0824   GLUCOSE 100 (H) 03/28/2013 1710   BUN 10 12/04/2020 0824   BUN 10 03/28/2013 1710   CREATININE 0.92 12/04/2020 0824   CALCIUM 8.6 12/04/2020 0824   CALCIUM 8.0 (L) 03/28/2013 1710   GFRNONAA 96 12/04/2020 0824   GFRAA 111 12/04/2020 0824    Lipid Panel     Component Value Date/Time   CHOL 148 12/04/2020 0824   CHOL 113 03/29/2013 0513   TRIG 393 (H) 12/04/2020 0824   TRIG 193 03/29/2013 0513   HDL 29 (L) 12/04/2020 0824   HDL 19 (L) 03/29/2013 0513   CHOLHDL 5.1 (H) 12/04/2020 0824   VLDL 33 (H) 07/13/2017 0904   VLDL 39 03/29/2013 0513   LDLCALC 72 12/04/2020 0824   LDLCALC 55 03/29/2013 0513    CBC    Component Value Date/Time   WBC 6.0 12/04/2020 0824   RBC 5.41 12/04/2020 0824   HGB 15.3 12/04/2020 0824   HGB 15.5 03/29/2013 1757   HCT 45.9 12/04/2020 0824   HCT 44.5 03/29/2013 1757   PLT 179 12/04/2020 0824   PLT 157 03/29/2013 1757   MCV 84.8 12/04/2020 0824   MCV 81 03/29/2013 1757   MCH 28.3 12/04/2020 0824   MCHC 33.3 12/04/2020 0824   RDW 13.3 12/04/2020 0824   RDW 13.5 03/29/2013 1757   LYMPHSABS 1,650 12/04/2020 0824   LYMPHSABS 1.7 03/29/2013 1757   MONOABS 270 07/13/2017 0904   MONOABS 0.4 03/29/2013 1757   EOSABS 192 12/04/2020 0824   EOSABS 0.1 03/29/2013 1757   BASOSABS 30 12/04/2020 0824   BASOSABS 0.0 03/29/2013 1757    Hgb A1C Lab Results  Component Value  Date   HGBA1C 6.6 (H) 05/14/2021            Assessment & Plan:   .Assessment and Plan    Peritonsillar Abscess Left-sided throat pain with difficulty swallowing, no fever, negative rapid strep test. Suspected peritonsillar abscess based on physical exam findings. -Administer intramuscular Rocephin  1 mg IM x 1 -Administer Depo-Medrol   80 mg IM x 1 -Start clindamycin  100 mg 3 times daily for 2 weeks. -Advise patient to go to the hospital if symptoms worsen, develop a fever, increased difficulty swallowing, drooling, or increased pain.  Follow-up with your PCP as previously scheduled Angeline Laura, NP

## 2023-12-17 NOTE — Patient Instructions (Signed)
 Peritonsillar Abscess  A peritonsillar abscess is an infected area in your throat that is filled with pus. It forms behind your tonsils. What are the causes? This condition is usually caused by streptococcal bacteria. What increases the risk? A recent diagnosis of an infection in your mouth or throat. Being a smoker. Having gum disease or gingivitis (periodontal disease). What are the signs or symptoms? Early symptoms of this condition include: Fever and chills. A sore throat, often with pain on just one side. Swollen, tender glands (lymph nodes) in the neck. Headache. As the infection gets worse, symptoms may include: Difficulty swallowing. Drooling because of difficulty swallowing saliva. Difficulty opening your mouth. Bad breath. Changes in how the voice sounds. How is this treated? Draining the pus. Your doctor may do this with a syringe and a needle or by making a cut in the abscess. Using antibiotic medicine. Follow these instructions at home: Medicines Take over-the-counter and prescription medicines only as told by your doctor. If you were prescribed an antibiotic, take it as told by your doctor. Do not stop taking the antibiotic even if you start to feel better. Eating and drinking  Drink enough fluid to keep your pee (urine) pale yellow. While your throat is sore, try one of these: Only drinking liquids. Eating only soft foods, such as yogurt and ice cream. General instructions Rest as told by your doctor. Return to your normal activities as told by your doctor. Ask your doctor what activities are safe for you. If your abscess was drained, rinse your mouth often with salt water. To make salt water, dissolve -1 tsp (3-6 g) of salt in 1 cup (237 mL) of warm water. Do not swallow this mixture. Do not smoke or use any products that contain nicotine or tobacco. If you need help quitting, ask your doctor. Keep all follow-up visits. Contact a doctor if: You have more  pain, swelling, redness, or pus in your throat. You have a headache. You have low energy or feel generally sick. You have a fever or chills. You have trouble swallowing or eating. You have signs of not enough water in the body (dehydration), such as: Feeling light-headed or dizzy when you are standing. Peeing (urinating) less than usual. A fast heart rate. Dry mouth. Get help right away if: You are unable to swallow. You have trouble breathing. Breathing is easier when you lean forward. You cough up blood. You vomit blood. You have very bad throat pain and it does not get better with medicine. These symptoms may be an emergency. Get help right away. Call your local emergency services (911 in the [place] Do not wait to see if the symptoms will go away. Do not drive yourself to the hospital. Summary A peritonsillar abscess is an infected area in your throat that is filled with pus. You may be treated by having the abscess drained and by taking antibiotic medicine. Contact a doctor if you have trouble swallowing or eating. Get help right away if you cough up blood or you vomit blood. This information is not intended to replace advice given to you by your health care provider. Make sure you discuss any questions you have with your health care provider. Document Revised: 04/05/2021 Document Reviewed: 04/05/2021 Elsevier Patient Education  2024 [employer].

## 2023-12-18 LAB — CBC WITH DIFFERENTIAL/PLATELET
Absolute Lymphocytes: 2016 {cells}/uL (ref 850–3900)
Absolute Monocytes: 454 {cells}/uL (ref 200–950)
Basophils Absolute: 29 {cells}/uL (ref 0–200)
Basophils Relative: 0.4 %
Eosinophils Absolute: 180 {cells}/uL (ref 15–500)
Eosinophils Relative: 2.5 %
HCT: 47.9 % (ref 38.5–50.0)
Hemoglobin: 15.9 g/dL (ref 13.2–17.1)
MCH: 28.3 pg (ref 27.0–33.0)
MCHC: 33.2 g/dL (ref 32.0–36.0)
MCV: 85.4 fL (ref 80.0–100.0)
MPV: 11.2 fL (ref 7.5–12.5)
Monocytes Relative: 6.3 %
Neutro Abs: 4522 {cells}/uL (ref 1500–7800)
Neutrophils Relative %: 62.8 %
Platelets: 187 10*3/uL (ref 140–400)
RBC: 5.61 10*6/uL (ref 4.20–5.80)
RDW: 12.8 % (ref 11.0–15.0)
Total Lymphocyte: 28 %
WBC: 7.2 10*3/uL (ref 3.8–10.8)

## 2023-12-18 LAB — COMPLETE METABOLIC PANEL WITH GFR
AG Ratio: 1.6 (calc) (ref 1.0–2.5)
ALT: 26 U/L (ref 9–46)
AST: 17 U/L (ref 10–35)
Albumin: 4.4 g/dL (ref 3.6–5.1)
Alkaline phosphatase (APISO): 92 U/L (ref 35–144)
BUN: 10 mg/dL (ref 7–25)
CO2: 27 mmol/L (ref 20–32)
Calcium: 9.2 mg/dL (ref 8.6–10.3)
Chloride: 97 mmol/L — ABNORMAL LOW (ref 98–110)
Creat: 0.93 mg/dL (ref 0.70–1.30)
Globulin: 2.8 g/dL (ref 1.9–3.7)
Glucose, Bld: 284 mg/dL — ABNORMAL HIGH (ref 65–99)
Potassium: 4.5 mmol/L (ref 3.5–5.3)
Sodium: 133 mmol/L — ABNORMAL LOW (ref 135–146)
Total Bilirubin: 0.9 mg/dL (ref 0.2–1.2)
Total Protein: 7.2 g/dL (ref 6.1–8.1)
eGFR: 98 mL/min/{1.73_m2} (ref >=60)

## 2023-12-18 LAB — LIPID PANEL
Cholesterol: 161 mg/dL (ref <200)
HDL: 32 mg/dL — ABNORMAL LOW (ref >=40)
LDL Cholesterol (Calc): 98 mg/dL
Non-HDL Cholesterol (Calc): 129 mg/dL (ref <130)
Total CHOL/HDL Ratio: 5 (calc) — ABNORMAL HIGH (ref <5.0)
Triglycerides: 224 mg/dL — ABNORMAL HIGH (ref <150)

## 2023-12-18 LAB — TESTOSTERONE: Testosterone: 109 ng/dL — ABNORMAL LOW (ref 250–827)

## 2023-12-18 LAB — HEMOGLOBIN A1C
Hgb A1c MFr Bld: 9.2 %{Hb} — ABNORMAL HIGH (ref <5.7)
Mean Plasma Glucose: 217 mg/dL
eAG (mmol/L): 12 mmol/L

## 2023-12-18 LAB — TSH: TSH: 3.95 m[IU]/L (ref 0.40–4.50)

## 2023-12-18 LAB — PSA: PSA: 0.32 ng/mL (ref <=4.00)

## 2023-12-24 LAB — COLOGUARD

## 2024-01-13 DIAGNOSIS — Z1211 Encounter for screening for malignant neoplasm of colon: Secondary | ICD-10-CM | POA: Diagnosis not present

## 2024-01-21 ENCOUNTER — Encounter: Payer: Self-pay | Admitting: Family Medicine

## 2024-01-21 LAB — COLOGUARD: COLOGUARD: NEGATIVE
